# Patient Record
Sex: Male | Born: 1950 | Race: White | Hispanic: No | Marital: Married | Smoking: Current some day smoker
Health system: Southern US, Community
[De-identification: ages and names within clinical notes are randomized; demographics above are authoritative.]

## PROBLEM LIST (undated history)

## (undated) DIAGNOSIS — I428 Other cardiomyopathies: Secondary | ICD-10-CM

## (undated) DIAGNOSIS — I10 Essential (primary) hypertension: Secondary | ICD-10-CM

## (undated) DIAGNOSIS — N183 Chronic kidney disease, stage 3 unspecified: Secondary | ICD-10-CM

## (undated) DIAGNOSIS — I4729 Other ventricular tachycardia: Secondary | ICD-10-CM

## (undated) DIAGNOSIS — I472 Ventricular tachycardia: Secondary | ICD-10-CM

## (undated) DIAGNOSIS — Z9889 Other specified postprocedural states: Secondary | ICD-10-CM

## (undated) HISTORY — PX: VASECTOMY: SHX75

## (undated) HISTORY — PX: HYDROCELE EXCISION: SHX482

---

## 2005-07-17 ENCOUNTER — Ambulatory Visit (HOSPITAL_COMMUNITY): Admission: RE | Admit: 2005-07-17 | Discharge: 2005-07-17 | Payer: Self-pay | Admitting: Internal Medicine

## 2005-07-17 ENCOUNTER — Ambulatory Visit: Payer: Self-pay | Admitting: Internal Medicine

## 2010-12-14 ENCOUNTER — Other Ambulatory Visit (HOSPITAL_COMMUNITY): Payer: Self-pay | Admitting: Orthopaedic Surgery

## 2010-12-14 ENCOUNTER — Ambulatory Visit (HOSPITAL_COMMUNITY)
Admission: RE | Admit: 2010-12-14 | Discharge: 2010-12-14 | Disposition: A | Discharge status: Home / Self Care | Payer: 59 | Source: Ambulatory Visit | Attending: Orthopaedic Surgery | Admitting: Orthopaedic Surgery

## 2010-12-14 DIAGNOSIS — L0390 Cellulitis, unspecified: Secondary | ICD-10-CM

## 2010-12-14 DIAGNOSIS — S86819A Strain of other muscle(s) and tendon(s) at lower leg level, unspecified leg, initial encounter: Secondary | ICD-10-CM | POA: Insufficient documentation

## 2010-12-14 DIAGNOSIS — S83289A Other tear of lateral meniscus, current injury, unspecified knee, initial encounter: Secondary | ICD-10-CM | POA: Insufficient documentation

## 2010-12-14 DIAGNOSIS — M7120 Synovial cyst of popliteal space [Baker], unspecified knee: Secondary | ICD-10-CM | POA: Insufficient documentation

## 2010-12-14 DIAGNOSIS — X58XXXA Exposure to other specified factors, initial encounter: Secondary | ICD-10-CM | POA: Insufficient documentation

## 2010-12-14 DIAGNOSIS — S838X9A Sprain of other specified parts of unspecified knee, initial encounter: Secondary | ICD-10-CM | POA: Insufficient documentation

## 2010-12-14 DIAGNOSIS — M25569 Pain in unspecified knee: Secondary | ICD-10-CM | POA: Insufficient documentation

## 2011-04-24 HISTORY — PX: KNEE SURGERY: SHX244

## 2014-06-19 ENCOUNTER — Encounter (HOSPITAL_COMMUNITY): Payer: Self-pay | Admitting: Internal Medicine

## 2014-06-19 ENCOUNTER — Inpatient Hospital Stay (HOSPITAL_COMMUNITY)
Admission: AD | Admit: 2014-06-19 | Discharge: 2014-06-24 | DRG: 287 | Disposition: A | Discharge status: Home / Self Care | Payer: Self-pay | Source: Other Acute Inpatient Hospital | Attending: Internal Medicine | Admitting: Internal Medicine

## 2014-06-19 DIAGNOSIS — I129 Hypertensive chronic kidney disease with stage 1 through stage 4 chronic kidney disease, or unspecified chronic kidney disease: Secondary | ICD-10-CM | POA: Diagnosis present

## 2014-06-19 DIAGNOSIS — Z720 Tobacco use: Secondary | ICD-10-CM | POA: Diagnosis present

## 2014-06-19 DIAGNOSIS — E785 Hyperlipidemia, unspecified: Secondary | ICD-10-CM | POA: Diagnosis present

## 2014-06-19 DIAGNOSIS — R42 Dizziness and giddiness: Secondary | ICD-10-CM | POA: Diagnosis not present

## 2014-06-19 DIAGNOSIS — F17210 Nicotine dependence, cigarettes, uncomplicated: Secondary | ICD-10-CM | POA: Diagnosis present

## 2014-06-19 DIAGNOSIS — R112 Nausea with vomiting, unspecified: Secondary | ICD-10-CM

## 2014-06-19 DIAGNOSIS — Z9114 Patient's other noncompliance with medication regimen: Secondary | ICD-10-CM | POA: Diagnosis present

## 2014-06-19 DIAGNOSIS — D72829 Elevated white blood cell count, unspecified: Secondary | ICD-10-CM | POA: Diagnosis present

## 2014-06-19 DIAGNOSIS — N179 Acute kidney failure, unspecified: Secondary | ICD-10-CM

## 2014-06-19 DIAGNOSIS — N183 Chronic kidney disease, stage 3 unspecified: Secondary | ICD-10-CM

## 2014-06-19 DIAGNOSIS — I10 Essential (primary) hypertension: Secondary | ICD-10-CM | POA: Diagnosis present

## 2014-06-19 DIAGNOSIS — D649 Anemia, unspecified: Secondary | ICD-10-CM | POA: Diagnosis present

## 2014-06-19 DIAGNOSIS — I429 Cardiomyopathy, unspecified: Secondary | ICD-10-CM | POA: Diagnosis present

## 2014-06-19 DIAGNOSIS — I161 Hypertensive emergency: Secondary | ICD-10-CM | POA: Diagnosis present

## 2014-06-19 DIAGNOSIS — I509 Heart failure, unspecified: Secondary | ICD-10-CM | POA: Insufficient documentation

## 2014-06-19 DIAGNOSIS — R778 Other specified abnormalities of plasma proteins: Secondary | ICD-10-CM | POA: Diagnosis present

## 2014-06-19 DIAGNOSIS — I472 Ventricular tachycardia: Secondary | ICD-10-CM | POA: Diagnosis not present

## 2014-06-19 DIAGNOSIS — I5021 Acute systolic (congestive) heart failure: Principal | ICD-10-CM | POA: Diagnosis present

## 2014-06-19 DIAGNOSIS — D696 Thrombocytopenia, unspecified: Secondary | ICD-10-CM | POA: Diagnosis present

## 2014-06-19 DIAGNOSIS — R7989 Other specified abnormal findings of blood chemistry: Secondary | ICD-10-CM | POA: Diagnosis present

## 2014-06-19 DIAGNOSIS — R0600 Dyspnea, unspecified: Secondary | ICD-10-CM

## 2014-06-19 LAB — GLUCOSE, CAPILLARY: Glucose-Capillary: 84 mg/dL (ref 70–99)

## 2014-06-19 MED ORDER — ONDANSETRON HCL 4 MG/2ML IJ SOLN
4.0000 mg | Freq: Four times a day (QID) | INTRAMUSCULAR | Status: DC | PRN
  Administered 2014-06-20 – 2014-06-23 (×8): 4 mg via INTRAVENOUS
  Filled 2014-06-19 (×9): qty 2

## 2014-06-19 MED ORDER — SODIUM CHLORIDE 0.9 % IJ SOLN
3.0000 mL | Freq: Two times a day (BID) | INTRAMUSCULAR | Status: DC
Start: 2014-06-19 — End: 2014-06-24
  Administered 2014-06-19 – 2014-06-23 (×5): 3 mL via INTRAVENOUS

## 2014-06-19 MED ORDER — CETYLPYRIDINIUM CHLORIDE 0.05 % MT LIQD
7.0000 mL | Freq: Two times a day (BID) | OROMUCOSAL | Status: DC
  Administered 2014-06-19 – 2014-06-24 (×6): 7 mL via OROMUCOSAL

## 2014-06-19 MED ORDER — NITROGLYCERIN 0.2 MG/HR TD PT24
0.2000 mg | MEDICATED_PATCH | Freq: Every day | TRANSDERMAL | Status: DC
  Administered 2014-06-20 (×2): 0.2 mg via TRANSDERMAL
  Filled 2014-06-19 (×4): qty 1

## 2014-06-19 MED ORDER — FUROSEMIDE 10 MG/ML IJ SOLN
40.0000 mg | Freq: Every day | INTRAMUSCULAR | Status: DC
  Administered 2014-06-20 (×2): 40 mg via INTRAVENOUS
  Filled 2014-06-19 (×2): qty 4

## 2014-06-19 MED ORDER — SODIUM CHLORIDE 0.9 % IV SOLN
250.0000 mL | INTRAVENOUS | Status: DC | PRN
  Administered 2014-06-20: 250 mL via INTRAVENOUS

## 2014-06-19 MED ORDER — HYDRALAZINE HCL 20 MG/ML IJ SOLN: 5.0000 mg | INTRAMUSCULAR | Status: DC | PRN

## 2014-06-19 MED ORDER — HEPARIN SODIUM (PORCINE) 5000 UNIT/ML IJ SOLN
5000.0000 [IU] | Freq: Three times a day (TID) | INTRAMUSCULAR | Status: DC
  Administered 2014-06-20 (×2): 5000 [IU] via SUBCUTANEOUS
  Filled 2014-06-19 (×2): qty 1

## 2014-06-19 MED ORDER — AMLODIPINE BESYLATE 5 MG PO TABS
5.0000 mg | ORAL_TABLET | Freq: Every day | ORAL | Status: DC
  Administered 2014-06-20 – 2014-06-23 (×4): 5 mg via ORAL
  Filled 2014-06-19: qty 1
  Filled 2014-06-19: qty 2
  Filled 2014-06-19 (×2): qty 1

## 2014-06-19 MED ORDER — CHLORHEXIDINE GLUCONATE CLOTH 2 % EX PADS
6.0000 | MEDICATED_PAD | Freq: Once | CUTANEOUS | Status: AC
  Administered 2014-06-19: 6 via TOPICAL

## 2014-06-19 MED ORDER — NICOTINE 21 MG/24HR TD PT24
21.0000 mg | MEDICATED_PATCH | Freq: Every day | TRANSDERMAL | Status: DC
  Administered 2014-06-20: 21 mg via TRANSDERMAL
  Filled 2014-06-19 (×4): qty 1

## 2014-06-19 MED ORDER — ASPIRIN EC 81 MG PO TBEC: 81.0000 mg | DELAYED_RELEASE_TABLET | Freq: Every day | ORAL | Status: DC

## 2014-06-19 MED ORDER — MORPHINE SULFATE 2 MG/ML IJ SOLN: 2.0000 mg | INTRAMUSCULAR | Status: DC | PRN

## 2014-06-19 MED ORDER — ACETAMINOPHEN 325 MG PO TABS: 650.0000 mg | ORAL_TABLET | ORAL | Status: DC | PRN

## 2014-06-19 MED ORDER — SODIUM CHLORIDE 0.9 % IJ SOLN: 3.0000 mL | INTRAMUSCULAR | Status: DC | PRN

## 2014-06-19 MED ORDER — ASPIRIN EC 81 MG PO TBEC
81.0000 mg | DELAYED_RELEASE_TABLET | Freq: Every day | ORAL | Status: DC
  Administered 2014-06-20 – 2014-06-24 (×5): 81 mg via ORAL
  Filled 2014-06-19 (×5): qty 1

## 2014-06-19 NOTE — H&P (Signed)
[facility] History and Physical  Wesley Riley INO:[id number] DOB: [birth year] DOA: 06/19/2014  Referring physician: ED physician PCP: [name], DO  Specialists:   Chief Complaint: Shortness of breath  HPI: Wesley Riley is a 64 y.o. male with past medical history tobacco abuse, hypertension, medication noncompliance, who presents with shortness of breath.  Patient reports that he has hypertension and has not been taking his medications for more than a year. 4 weeks ago, he had one episode of shortness of breath. He was evaluated by his PCP, who thought his SOB was likely due to uncontrolled HTN. He was given prescription of benazepril and hydrochlorothiazide. He took the medications for a few days, and then stopped taking these medications because of headache. At about 3 AM, he started having shortness stress, which has been progressively getting worse. He had mild cough with clear mucus production. He also had chest pressure. He did not have fever or chills. He was evaluated in the emergency room of [facility] hospital. He was found to have pulmonary edema, elevated BNP and troponin 0.53. He was treated as acute congestive heart failure with Lasix and aspirin. He urinated well and feels better. He is transferred to [facility] hospital for further evaluation and treatment.  Patient denies fever, chills, abdominal pain, diarrhea, constipation, dysuria, urgency, frequency, hematuria, skin rashes, joint pain or leg swelling. No unilateral weakness, numbness or tingling sensations. No vision change or hearing loss.  In ED of [facility] hospital, patient was found to have elevated Bp at 162/114, elevated BNP 1127, elevated troponin 0.18--> 0.53, negative UDS, negative urinalysis for UTI, but positive for protein and hemoglobin, creatinine 1.33, temperature normal. Chest x-ray showed pulmonary edema. EKG showed T-wave inversion in V4 to V6. Patient is admitted to inpatient for further evaluation and  treatment.  Review of Systems: As presented in the history of presenting illness, rest negative.  Where does patient live?  At home Can patient participate in ADLs? Yes  Allergy:  Allergies  Allergen Reactions  . Penicillins     Past Medical History  Diagnosis Date  . HTN (hypertension)   . Tobacco abuse     Past Surgical History  Procedure Laterality Date  . Vasectomy    . Hydrocele excision      Social History:  reports that he has been smoking Cigars and Cigarettes.  He has a 46 pack-year smoking history. He uses smokeless tobacco. His alcohol and drug histories are not on file.  Family History:  Family History  Problem Relation Age of Onset  . Stroke Mother   . Heart disease       Prior to Admission medications   Not on File    Physical Exam: Filed Vitals:   06/19/14 2129  BP: 142/90  Pulse: 85  Temp: 98.5 F (36.9 C)  TempSrc: Oral  Resp: 20  SpO2: 100%   General: Not in acute distress HEENT:       Eyes: PERRL, EOMI, no scleral icterus       ENT: No discharge from the ears and nose, no pharynx injection, no tonsillar enlargement.        Neck: No JVD, no bruit, no mass felt. Cardiac: S1/S2, RRR, No murmurs, No gallops or rubs Pulm: diffused crackles bilaterally Abd: Soft, nondistended, nontender, no rebound pain, no organomegaly, BS present Ext: No edema bilaterally. 2+DP/PT pulse bilaterally Musculoskeletal: No joint deformities, erythema, or stiffness, ROM full Skin: No rashes.  Neuro: Alert and oriented X3, cranial nerves II-XII grossly  intact, muscle strength 5/5 in all extremeties, sensation to light touch intact. Brachial reflex 2+ bilaterally. Knee reflex 1+ bilaterally. Negative Babinski's sign. Normal finger to nose test. Psych: Patient is not psychotic, no suicidal or hemocidal ideation.  Labs on Admission:  Basic Metabolic Panel: No results for input(s): NA, K, CL, CO2, GLUCOSE, BUN, CREATININE, CALCIUM, MG, PHOS in the last 168  hours. Liver Function Tests: No results for input(s): AST, ALT, ALKPHOS, BILITOT, PROT, ALBUMIN in the last 168 hours. No results for input(s): LIPASE, AMYLASE in the last 168 hours. No results for input(s): AMMONIA in the last 168 hours. CBC: No results for input(s): WBC, NEUTROABS, HGB, HCT, MCV, PLT in the last 168 hours. Cardiac Enzymes: No results for input(s): CKTOTAL, CKMB, CKMBINDEX, TROPONINI in the last 168 hours.  BNP (last 3 results) No results for input(s): BNP in the last 8760 hours.  ProBNP (last 3 results) No results for input(s): PROBNP in the last 8760 hours.  CBG:  Recent Labs Lab 06/19/14 2226  GLUCAP 84    Radiological Exams on Admission: No results found.  EKG: Independently reviewed. T-wave inversion in V4 to V6.  Assessment/Plan Principal Problem:   Acute CHF Active Problems:   Tobacco abuse   Essential hypertension   Hypertensive emergency   Elevated troponin   Leukocytosis  Acute CHF: SOB is most likely due to flush of pulmonary edema secondary to acute congestive heart failure. Etiology is most liley due to elevated blood pressure secondary to medication noncompliance. Other differential diagnosis include, but less likely, pneumonia (no infiltration on chest x-ray) and pulmonary embolism (patient does not have any chest pain).  -will admit to Telemetry bed. -will treat with IV lasix 40 mg qdaily (patient received 2 dose of Lasix in ED) -ASA and amlodipine -NTG patch -will not start BB in the setting of acute CHF -will not start ACEI due to Cre=1.33, but may switch amlodipine to ACEI when renal Fx improves -will check TSH, A1c, FLP -will get 2-D echo to evaluate EF -will get EKG -strict In/Out -Daily body weight. -heart diet  HTN: Patient presents with hypertensive emergency with elevated blood pressure, acute congestive heart failure and worsening renal function. Trop is also elevated, which is likely due to demanding  ischemic. -started nitroglycerin patch -Amlodipine -On IV Lasix -IV hydralazine when necessary  Elevated trop: 0.18-->0.53. It is most likely due to demanding ischemia. Currently patient does not have chest pain. EKG has T-wave inversion in V4 to V6. -Aspirin, nitroglycerin patch, morphine when necessary, lipitor -will cycle CE X3  Leukocytosis: Likely due to stress. The patient does not have signs infection. Urinalysis negative for UTI. Chest x-ray has no infiltration -follow up CBC  Tobacco abuse: Smokes 1 pack day for more than 40 years. -Nicotine patch -Counseled pt about importance of quitting smoking.   DVT ppx: SQ Heparin    Code Status: Full code Family Communication: None at bed side.      Disposition Plan: Admit to inpatient   Date of Service 06/19/2014    Lorretta Harp [facility] Pager [phone]  If 7PM-7AM, please contact night-coverage [email or web address] Password [facility] 06/19/2014, 10:59 PM

## 2014-06-20 ENCOUNTER — Inpatient Hospital Stay (HOSPITAL_COMMUNITY): Payer: Self-pay

## 2014-06-20 DIAGNOSIS — I509 Heart failure, unspecified: Secondary | ICD-10-CM

## 2014-06-20 DIAGNOSIS — I214 Non-ST elevation (NSTEMI) myocardial infarction: Secondary | ICD-10-CM

## 2014-06-20 LAB — TROPONIN I
Troponin I: 0.27 ng/mL — ABNORMAL HIGH (ref <0.031)
Troponin I: 0.26 ng/mL — ABNORMAL HIGH (ref <0.031)
Troponin I: 0.15 ng/mL — ABNORMAL HIGH (ref <0.031)

## 2014-06-20 LAB — CBC WITH DIFFERENTIAL/PLATELET
Basophils Absolute: 0 10*3/uL (ref 0.0–0.1)
Basophils Relative: 0 % (ref 0–1)
Eosinophils Absolute: 0.1 10*3/uL (ref 0.0–0.7)
Eosinophils Relative: 1 % (ref 0–5)
HEMATOCRIT: 35.8 % — AB (ref 39.0–52.0)
HEMOGLOBIN: 12.1 g/dL — AB (ref 13.0–17.0)
Lymphocytes Relative: 25 % (ref 12–46)
Lymphs Abs: 2.7 10*3/uL (ref 0.7–4.0)
MCH: 32.5 pg (ref 26.0–34.0)
MCHC: 33.8 g/dL (ref 30.0–36.0)
MCV: 96.2 fL (ref 78.0–100.0)
MONOS PCT: 10 % (ref 3–12)
Monocytes Absolute: 1 10*3/uL (ref 0.1–1.0)
NEUTROS ABS: 6.6 10*3/uL (ref 1.7–7.7)
NEUTROS PCT: 64 % (ref 43–77)
Platelets: 126 10*3/uL — ABNORMAL LOW (ref 150–400)
RBC: 3.72 MIL/uL — ABNORMAL LOW (ref 4.22–5.81)
RDW: 13.7 % (ref 11.5–15.5)
WBC: 10.4 10*3/uL (ref 4.0–10.5)

## 2014-06-20 LAB — LIPID PANEL
CHOLESTEROL: 176 mg/dL (ref 0–200)
HDL: 41 mg/dL (ref >39)
LDL Cholesterol: 123 mg/dL — ABNORMAL HIGH (ref 0–99)
TRIGLYCERIDES: 58 mg/dL (ref <150)
Total CHOL/HDL Ratio: 4.3 RATIO
VLDL: 12 mg/dL (ref 0–40)

## 2014-06-20 LAB — COMPREHENSIVE METABOLIC PANEL
ALBUMIN: 3 g/dL — AB (ref 3.5–5.2)
ALK PHOS: 62 U/L (ref 39–117)
ALT: 13 U/L (ref 0–53)
AST: 17 U/L (ref 0–37)
Anion gap: 9 (ref 5–15)
BUN: 21 mg/dL (ref 6–23)
CHLORIDE: 104 mmol/L (ref 96–112)
CO2: 26 mmol/L (ref 19–32)
Calcium: 7.9 mg/dL — ABNORMAL LOW (ref 8.4–10.5)
Creatinine, Ser: 1.69 mg/dL — ABNORMAL HIGH (ref 0.50–1.35)
GFR calc Af Amer: 48 mL/min — ABNORMAL LOW (ref >90)
GFR, EST NON AFRICAN AMERICAN: 41 mL/min — AB (ref >90)
Glucose, Bld: 104 mg/dL — ABNORMAL HIGH (ref 70–99)
POTASSIUM: 3.1 mmol/L — AB (ref 3.5–5.1)
SODIUM: 139 mmol/L (ref 135–145)
Total Bilirubin: 0.6 mg/dL (ref 0.3–1.2)
Total Protein: 5.6 g/dL — ABNORMAL LOW (ref 6.0–8.3)

## 2014-06-20 LAB — BASIC METABOLIC PANEL
Anion gap: 8 (ref 5–15)
BUN: 20 mg/dL (ref 6–23)
CO2: 27 mmol/L (ref 19–32)
CREATININE: 1.59 mg/dL — AB (ref 0.50–1.35)
Calcium: 8.1 mg/dL — ABNORMAL LOW (ref 8.4–10.5)
Chloride: 105 mmol/L (ref 96–112)
GFR calc non Af Amer: 45 mL/min — ABNORMAL LOW (ref >90)
GFR, EST AFRICAN AMERICAN: 52 mL/min — AB (ref >90)
Glucose, Bld: 104 mg/dL — ABNORMAL HIGH (ref 70–99)
POTASSIUM: 3 mmol/L — AB (ref 3.5–5.1)
Sodium: 140 mmol/L (ref 135–145)

## 2014-06-20 LAB — CBC
HEMATOCRIT: 36.5 % — AB (ref 39.0–52.0)
Hemoglobin: 12.4 g/dL — ABNORMAL LOW (ref 13.0–17.0)
MCH: 33.1 pg (ref 26.0–34.0)
MCHC: 34 g/dL (ref 30.0–36.0)
MCV: 97.3 fL (ref 78.0–100.0)
Platelets: 115 10*3/uL — ABNORMAL LOW (ref 150–400)
RBC: 3.75 MIL/uL — ABNORMAL LOW (ref 4.22–5.81)
RDW: 13.8 % (ref 11.5–15.5)
WBC: 9 10*3/uL (ref 4.0–10.5)

## 2014-06-20 LAB — HEPARIN LEVEL (UNFRACTIONATED): Heparin Unfractionated: 0.18 IU/mL — ABNORMAL LOW (ref 0.30–0.70)

## 2014-06-20 LAB — SODIUM, URINE, RANDOM: Sodium, Ur: 47 mmol/L

## 2014-06-20 LAB — MAGNESIUM: Magnesium: 1.6 mg/dL (ref 1.5–2.5)

## 2014-06-20 LAB — CREATININE, URINE, RANDOM: CREATININE, URINE: 79.79 mg/dL

## 2014-06-20 LAB — MRSA PCR SCREENING: MRSA BY PCR: NEGATIVE

## 2014-06-20 LAB — TSH: TSH: 2.283 u[IU]/mL (ref 0.350–4.500)

## 2014-06-20 LAB — BRAIN NATRIURETIC PEPTIDE: B Natriuretic Peptide: 994.8 pg/mL — ABNORMAL HIGH (ref 0.0–100.0)

## 2014-06-20 MED ORDER — SODIUM CHLORIDE 0.9 % IV SOLN: 250.0000 mL | INTRAVENOUS | Status: DC | PRN

## 2014-06-20 MED ORDER — CARVEDILOL 3.125 MG PO TABS: 3.1250 mg | ORAL_TABLET | Freq: Two times a day (BID) | ORAL | Status: DC

## 2014-06-20 MED ORDER — SODIUM CHLORIDE 0.9 % IV SOLN
1.0000 mL/kg/h | INTRAVENOUS | Status: DC
  Administered 2014-06-21: 1 mL/kg/h via INTRAVENOUS

## 2014-06-20 MED ORDER — MAGNESIUM SULFATE 2 GM/50ML IV SOLN
2.0000 g | Freq: Once | INTRAVENOUS | Status: AC
  Administered 2014-06-20: 2 g via INTRAVENOUS
  Filled 2014-06-20: qty 50

## 2014-06-20 MED ORDER — FUROSEMIDE 10 MG/ML IJ SOLN: 40.0000 mg | Freq: Two times a day (BID) | INTRAMUSCULAR | Status: DC

## 2014-06-20 MED ORDER — HEPARIN BOLUS VIA INFUSION
2000.0000 [IU] | Freq: Once | INTRAVENOUS | Status: AC
  Administered 2014-06-20: 2000 [IU] via INTRAVENOUS
  Filled 2014-06-20: qty 2000

## 2014-06-20 MED ORDER — HEPARIN (PORCINE) IN NACL 100-0.45 UNIT/ML-% IJ SOLN
1350.0000 [IU]/h | INTRAMUSCULAR | Status: DC
  Administered 2014-06-20: 950 [IU]/h via INTRAVENOUS
  Filled 2014-06-20 (×2): qty 250

## 2014-06-20 MED ORDER — ATORVASTATIN CALCIUM 80 MG PO TABS
80.0000 mg | ORAL_TABLET | Freq: Every day | ORAL | Status: DC
  Administered 2014-06-20: 80 mg via ORAL
  Filled 2014-06-20: qty 1

## 2014-06-20 MED ORDER — SODIUM CHLORIDE 0.9 % IJ SOLN: 3.0000 mL | INTRAMUSCULAR | Status: DC | PRN

## 2014-06-20 MED ORDER — CARVEDILOL 3.125 MG PO TABS
3.1250 mg | ORAL_TABLET | Freq: Two times a day (BID) | ORAL | Status: DC
  Administered 2014-06-20 – 2014-06-24 (×8): 3.125 mg via ORAL
  Filled 2014-06-20 (×8): qty 1

## 2014-06-20 MED ORDER — ATORVASTATIN CALCIUM 40 MG PO TABS: 40.0000 mg | ORAL_TABLET | Freq: Every day | ORAL | Status: DC

## 2014-06-20 MED ORDER — POTASSIUM CHLORIDE CRYS ER 20 MEQ PO TBCR
40.0000 meq | EXTENDED_RELEASE_TABLET | Freq: Once | ORAL | Status: AC
  Administered 2014-06-20: 40 meq via ORAL
  Filled 2014-06-20: qty 2

## 2014-06-20 MED ORDER — POTASSIUM CHLORIDE CRYS ER 20 MEQ PO TBCR
40.0000 meq | EXTENDED_RELEASE_TABLET | Freq: Every day | ORAL | Status: DC
  Administered 2014-06-21 – 2014-06-24 (×4): 40 meq via ORAL
  Filled 2014-06-20 (×4): qty 2

## 2014-06-20 MED ORDER — ASPIRIN 81 MG PO CHEW
81.0000 mg | CHEWABLE_TABLET | ORAL | Status: AC
  Administered 2014-06-21: 81 mg via ORAL
  Filled 2014-06-20: qty 1

## 2014-06-20 MED ORDER — SODIUM CHLORIDE 0.9 % IJ SOLN
3.0000 mL | Freq: Two times a day (BID) | INTRAMUSCULAR | Status: DC
Start: 2014-06-20 — End: 2014-06-21
  Administered 2014-06-21: 3 mL via INTRAVENOUS

## 2014-06-20 NOTE — Progress Notes (Signed)
Pt resting in bed had 13 beats of v tach while resting in bed and denies any CP or SOB.  Md on call made aware. No new orders received. Will cont to monitor pt.

## 2014-06-20 NOTE — Progress Notes (Signed)
ANTICOAGULATION CONSULT NOTE - Follow-up Consult  Pharmacy Consult for Heparin Indication: chest pain/ACS  Allergies  Allergen Reactions  . Penicillins Hives and Rash    Patient Measurements: Height: 5' 9.5" (176.5 cm) Weight: 175 lb 14.4 oz (79.788 kg) IBW/kg (Calculated) : 71.85  Vital Signs: Temp: 97.8 F (36.6 C) (02/28 1645) Temp Source: Oral (02/28 1645) BP: 137/81 mmHg (02/28 1728) Pulse Rate: 88 (02/28 1728)  Labs:  Recent Labs  06/20/14 0118 06/20/14 0316 06/20/14 1120 06/20/14 1657  HGB 12.1* 12.4*  --   --   HCT 35.8* 36.5*  --   --   PLT 126* 115*  --   --   HEPARINUNFRC  --   --   --  0.18*  CREATININE 1.69* 1.59*  --   --   TROPONINI 0.27* 0.26* 0.15*  --     Estimated Creatinine Clearance: 48.4 mL/min (by C-G formula based on Cr of 1.59).   Assessment: 64 yo M on heparin for ACS. Heparin level subtherapeutic(0.18) on 950 units/hr. Level drawn ~5hr post heparin start. No issues with line or bleeding per RN.   Goal of Therapy:  Heparin level 0.3-0.7 units/ml Monitor platelets by anticoagulation protocol: Yes   Plan:  - Heparin IV bolus 2000 units - Heparin gtt increase to 1150 units/hr - F/u 6 hour HL - Daily HL/CBC  Christoper Fabian, PharmD, BCPS Clinical pharmacist, pager [phone] 06/20/2014 5:35 PM

## 2014-06-20 NOTE — Consult Note (Addendum)
Primary Physician: [name], DO Reason for Consultation: HF + troponin  HPI:  [name] is a 64 year old male with history of tobacco abuse, CKD stage III, hypertension (140/90 range - noncompliant with meds) who we are asked to see due to chest tightness and HF.  Denies h/o known heart disease. Had normal stress test in 2000.   Says he has not been taking his medication for over a year. On Friday night develop acute chest pressure associated with dypnea that lasted well over an hour. Breathing got worse so went to [facility] ER. At [facility], found to have pulmonary edema on CXR, elevated BNP and a troponin of 0.53. He has been responded well to IV Lasix and now feels better. ECG with diffuse TWI.  About 4 weeks ago had a similar episode of chest pressure but not as severe. Denies orthopnea, PND, progressive weight gain or edema.   Has had 15 beat NSVT    Review of Systems:     Cardiac Review of Systems: {Y] = yes [ ]  = no  Chest Pain [ y   ]  Resting SOB [ y  ] Exertional SOB  [  ]  Orthopnea [  ]   Pedal Edema [   ]    Palpitations [  ] Syncope  [  ]   Presyncope [   ]  General Review of Systems: [Y] = yes [  ]=no Constitional: recent weight change [  ]; anorexia [  ]; fatigue [  ]; nausea [  ]; night sweats [  ]; fever [  ]; or chills [  ];                 Eyes : blurred vision [  ]; diplopia [   ]; vision changes [  ];  Amaurosis fugax[  ]; Resp: cough [place].Etienne  ];  wheezing[  ];  hemoptysis[  ];  PND [  ];  GI:  gallstones[  ], vomiting[  ];  dysphagia[  ]; melena[  ];  hematochezia [  ]; heartburn[  ];   GU: kidney stones [  ]; hematuria[  ];   dysuria [  ];  nocturia[  ]; incontinence [  ];             Skin: rash, swelling[  ];, hair loss[  ];  peripheral edema[  ];  or itching[  ]; Musculosketetal: myalgias[  ];  joint swelling[  ];  joint erythema[  ];  joint pain[y  ];  back pain[  ];  Heme/Lymph: bruising[  ];  bleeding[  ];  anemia[  ];  Neuro: TIA[  ];  headaches[  ];   stroke[  ];  vertigo[  ];  seizures[  ];   paresthesias[  ];  difficulty walking[  ];  Psych:depression[  ]; anxiety[  ];  Endocrine: diabetes[  ];  thyroid dysfunction[  ];  Other:  Past Medical History  Diagnosis Date  . HTN (hypertension)   . Tobacco abuse     No prescriptions prior to admission     . amLODipine  5 mg Oral Daily  . antiseptic oral rinse  7 mL Mouth Rinse BID  . aspirin EC  81 mg Oral Daily  . atorvastatin  80 mg Oral q1800  . carvedilol  3.125 mg Oral BID WC  . furosemide  40 mg Intravenous BID  . heparin  5,000 Units Subcutaneous 3 times per day  . nicotine  21 mg Transdermal Daily  . nitroGLYCERIN  0.2 mg Transdermal Daily  . [START ON 06/21/2014] potassium chloride  40 mEq Oral Daily  . sodium chloride  3 mL Intravenous Q12H    Infusions:    Allergies  Allergen Reactions  . Penicillins     History   Social History  . Marital Status: Married    Spouse Name: N/A  . Number of Children: N/A  . Years of Education: N/A   Occupational History  . Not on file.   Social History Main Topics  . Smoking status: Current Every Day Smoker -- 1.00 packs/day for 46 years    Types: Cigars, Cigarettes  . Smokeless tobacco: Current User  . Alcohol Use: Not on file  . Drug Use: Not on file  . Sexual Activity: Not on file   Other Topics Concern  . Not on file   Social History Narrative  . No narrative on file    Family History  Problem Relation Age of Onset  . Stroke Mother   . Heart disease      PHYSICAL EXAM: Filed Vitals:   06/20/14 0735  BP: 128/84  Pulse: 79  Temp: 98.8 F (37.1 C)  Resp: 20     Intake/Output Summary (Last 24 hours) at 06/20/14 1005 Last data filed at 06/20/14 0826  Gross per 24 hour  Intake    595 ml  Output   1125 ml  Net   -530 ml    General:  Well appearing. No respiratory difficulty HEENT: normal Neck: supple. JVP 6. Carotids 2+ bilat; no bruits. No lymphadenopathy or thryomegaly appreciated. Cor: PMI  nondisplaced. Regular rate & rhythm. No rubs, gallops or murmurs. Lungs: basilar crackles  Abdomen: soft, nontender, nondistended. No hepatosplenomegaly. No bruits or masses. Good bowel sounds. Extremities: no cyanosis, clubbing, rash, edema Neuro: alert & oriented x 3, cranial nerves grossly intact. moves all 4 extremities w/o difficulty. Affect pleasant.  ECG: NSR with diffuse TWI   Results for orders placed or performed during the hospital encounter of 06/19/14 (from the past 24 hour(s))  MRSA PCR Screening     Status: None   Collection Time: 06/19/14  9:35 PM  Result Value Ref Range   MRSA by PCR NEGATIVE NEGATIVE  Glucose, capillary     Status: None   Collection Time: 06/19/14 10:26 PM  Result Value Ref Range   Glucose-Capillary 84 70 - 99 mg/dL  Creatinine, urine, random     Status: None   Collection Time: 06/20/14 12:14 AM  Result Value Ref Range   Creatinine, Urine 79.79 mg/dL  Sodium, urine, random     Status: None   Collection Time: 06/20/14 12:14 AM  Result Value Ref Range   Sodium, Ur 47 mmol/L  Troponin I (q 6hr x 3)     Status: Abnormal   Collection Time: 06/20/14  1:18 AM  Result Value Ref Range   Troponin I 0.27 (H) <0.031 ng/mL  Comprehensive metabolic panel     Status: Abnormal   Collection Time: 06/20/14  1:18 AM  Result Value Ref Range   Sodium 139 135 - 145 mmol/L   Potassium 3.1 (L) 3.5 - 5.1 mmol/L   Chloride 104 96 - 112 mmol/L   CO2 26 19 - 32 mmol/L   Glucose, Bld 104 (H) 70 - 99 mg/dL   BUN 21 6 - 23 mg/dL   Creatinine, Ser 6.44 (H) 0.50 - 1.35 mg/dL   Calcium 7.9 (L) 8.4 - 10.5 mg/dL  Total Protein 5.6 (L) 6.0 - 8.3 g/dL   Albumin 3.0 (L) 3.5 - 5.2 g/dL   AST 17 0 - 37 U/L   ALT 13 0 - 53 U/L   Alkaline Phosphatase 62 39 - 117 U/L   Total Bilirubin 0.6 0.3 - 1.2 mg/dL   GFR calc non Af Amer 41 (L) >90 mL/min   GFR calc Af Amer 48 (L) >90 mL/min   Anion gap 9 5 - 15  CBC with Differential/Platelet     Status: Abnormal   Collection Time:  06/20/14  1:18 AM  Result Value Ref Range   WBC 10.4 4.0 - 10.5 K/uL   RBC 3.72 (L) 4.22 - 5.81 MIL/uL   Hemoglobin 12.1 (L) 13.0 - 17.0 g/dL   HCT 83.6 (L) 62.9 - 47.6 %   MCV 96.2 78.0 - 100.0 fL   MCH 32.5 26.0 - 34.0 pg   MCHC 33.8 30.0 - 36.0 g/dL   RDW 54.6 50.3 - 54.6 %   Platelets 126 (L) 150 - 400 K/uL   Neutrophils Relative % 64 43 - 77 %   Neutro Abs 6.6 1.7 - 7.7 K/uL   Lymphocytes Relative 25 12 - 46 %   Lymphs Abs 2.7 0.7 - 4.0 K/uL   Monocytes Relative 10 3 - 12 %   Monocytes Absolute 1.0 0.1 - 1.0 K/uL   Eosinophils Relative 1 0 - 5 %   Eosinophils Absolute 0.1 0.0 - 0.7 K/uL   Basophils Relative 0 0 - 1 %   Basophils Absolute 0.0 0.0 - 0.1 K/uL  Brain natriuretic peptide     Status: Abnormal   Collection Time: 06/20/14  1:18 AM  Result Value Ref Range   B Natriuretic Peptide 994.8 (H) 0.0 - 100.0 pg/mL  Troponin I (q 6hr x 3)     Status: Abnormal   Collection Time: 06/20/14  3:16 AM  Result Value Ref Range   Troponin I 0.26 (H) <0.031 ng/mL  Basic metabolic panel     Status: Abnormal   Collection Time: 06/20/14  3:16 AM  Result Value Ref Range   Sodium 140 135 - 145 mmol/L   Potassium 3.0 (L) 3.5 - 5.1 mmol/L   Chloride 105 96 - 112 mmol/L   CO2 27 19 - 32 mmol/L   Glucose, Bld 104 (H) 70 - 99 mg/dL   BUN 20 6 - 23 mg/dL   Creatinine, Ser 5.68 (H) 0.50 - 1.35 mg/dL   Calcium 8.1 (L) 8.4 - 10.5 mg/dL   GFR calc non Af Amer 45 (L) >90 mL/min   GFR calc Af Amer 52 (L) >90 mL/min   Anion gap 8 5 - 15  CBC     Status: Abnormal   Collection Time: 06/20/14  3:16 AM  Result Value Ref Range   WBC 9.0 4.0 - 10.5 K/uL   RBC 3.75 (L) 4.22 - 5.81 MIL/uL   Hemoglobin 12.4 (L) 13.0 - 17.0 g/dL   HCT 12.7 (L) 51.7 - 00.1 %   MCV 97.3 78.0 - 100.0 fL   MCH 33.1 26.0 - 34.0 pg   MCHC 34.0 30.0 - 36.0 g/dL   RDW 74.9 44.9 - 67.5 %   Platelets 115 (L) 150 - 400 K/uL  Magnesium     Status: None   Collection Time: 06/20/14  3:16 AM  Result Value Ref Range    Magnesium 1.6 1.5 - 2.5 mg/dL  TSH     Status: None   Collection Time:  06/20/14  3:16 AM  Result Value Ref Range   TSH 2.283 0.350 - 4.500 uIU/mL  Lipid panel     Status: Abnormal   Collection Time: 06/20/14  3:16 AM  Result Value Ref Range   Cholesterol 176 0 - 200 mg/dL   Triglycerides 58 <829 mg/dL   HDL 41 >56 mg/dL   Total CHOL/HDL Ratio 4.3 RATIO   VLDL 12 0 - 40 mg/dL   LDL Cholesterol 213 (H) 0 - 99 mg/dL   No results found.   ASSESSMENT: 1. NSTEMI 2. Acute heart failure, EF unknown 3. Tobacco use ongoing 4. CKD, stage 3 5. NSVT 6. Hypokalemia 7. Hypertension   PLAN/DISCUSSION:  Although troponin is fairly flat, I suspect he had acute HF in setting of NSTEMI. (Although it is possible that all symptoms due to HF) He is now improved with diuresis and looks euvolemic. Suspect he has significant CAD.  Agree with echo. Will need cath. Start heparin, ASA, statin, low-dose b-blocker. Supp K and Mag. Hold lasix pre-cath. Counseled on need to stop smoking.   I have reviewed the risks, indications, and alternatives to angioplasty and stenting with the patient. Risks include but are not limited to bleeding, infection, vascular injury, stroke, myocardial infection, arrhythmia, kidney injury, radiation-related injury in the case of prolonged fluoroscopy use, emergency cardiac surgery, and death. The patient understands the risks of serious complication is low (<1%) and he agrees to proceed.    Daniel Bensimhon,MD 10:33 AM

## 2014-06-20 NOTE — Progress Notes (Signed)
CSW has asked Chaplain to see pt for Advance Directives as no packets available to CSW.  Unit CSW may be consulted for notary services, if necessary.

## 2014-06-20 NOTE — Progress Notes (Addendum)
[facility] PROGRESS NOTE  ROBB SIBAL ZOX:[id number] DOB: 04/08/1951 DOA: 06/19/2014 PCP: [name], DO  Brief summary  64 year old male with history of tobacco abuse, hypertension, medical noncompliance who presented with shortness of breath. He had not been taking his medication for over a year. He developed shortness of breath proximally 4 weeks prior to presentation and despite resuming his blood pressure medications he continued to have worsening shortness of breath, cough, chest pressure.  CHest pressure occurred at rest while working on his computer and was associated with SOB.  Not exertion-related tightness.  Has had weight loss over the last few years.  Denies lower extremity edema.  He was seen in [facility] and was transferred to [facility] because he had pulmonary edema, elevated BNP and a troponin of 0.53. He has been responding to IV Lasix.  Assessment/Plan  Acute systolic vs. Diastolic CHF, likely related to uncontrolled HTN and possible CAD - Tele:  13-beat run of V-tach -  ECG:  Deep t-wave inversions in inferolateral leads - continue lasix  IV and increase to BID - ASA and amlodipine - NTG patch - start low dose carvedilol - no ACEI 2/2 kidney function  - TSH wnl - A1c pending -  FLP:  LDL elevated, start statin -  -1.1L -  Daily weights -  Heart failure education  V-tach -  Start beta blocker, low dose -  Keep K and Mag wnl  History sounds concerning for Unstable angina, rise in troponin suggests NSTEMI although AKI/CKD may have elevated troponin also/hypertensive emergency, BP trending down -Aspirin, nitroglycerin patch, morphine when necessary, lipitor -  Troponin peak at 0.53, trended down to 0.26 -  Some elevation may be related to - continue nitroglycerin patch - Amlodipine - lasix as above - IV hydralazine when necessary - start heparin gtt  Acute vs. Chronic kidney disease  -  RUS -  Trend creatinine -  Renally  dose medications and minimize nephrotoxins  Leukocytosis: Likely due to stress, resolved.   -  No evidence of underlying infection  Tobacco abuse: Smokes 1 pack day for more than 40 years. -Nicotine patch -Counseled pt about importance of quitting smoking.  Normocytic anemia and thrombocytopenia  -  Check differential -  Iron studies, b12, folate -  TSH wnl -  Occult stool -  No evidence fo bleeding  Diet:  Low salt Access:  PIV IVF:  off Proph:  heparin  Code Status: full Family Communication: patient and wife Disposition Plan: pending improvement in breathing, further ischemic work up as indicated per cardiology   Consultants:  Cardiology  Procedures:  ECHO  RUS  Antibiotics:  none   HPI/Subjective:  Breathing is much better.  No current chest pain or SOB.  Attributes his breathing problems to post-nasal gtt and mold/mildew problems.    Objective: Filed Vitals:   06/20/14 0000 06/20/14 0021 06/20/14 0421 06/20/14 0735  BP: 131/78 133/86 131/88 128/84  Pulse: 79 81 80 79  Temp:  98.2 F (36.8 C) 99.1 F (37.3 C) 98.8 F (37.1 C)  TempSrc:  Oral Oral Oral  Resp: Weight:   79.788 kg (175 lb 14.4 oz)   SpO2: 97% 97% 96% 96%    Intake/Output Summary (Last 24 hours) at 06/20/14 0936 Last data filed at 06/20/14 0826  Gross per 24 hour  Intake    595 ml  Output   1125 ml  Net   -530 ml   [employer]  06/20/14 0421  Weight: 79.788 kg (175 lb 14.4 oz)    Exam:   General:  WM, No acute distress  HEENT:  NCAT, MMM  Cardiovascular:  RRR, nl S1, S2, + gallop, 2+ pulses, warm extremities  Respiratory:  Rales at bilateral bases, no rhonchi or wheeze, no increased WOB  Abdomen:   NABS, soft, NT/ND  MSK:   Normal tone and bulk, no LEE  Neuro:  Grossly intact  Data Reviewed: Basic Metabolic Panel:  Recent Labs Lab 06/20/14 0118 06/20/14 0316  NA 139 140  K 3.1* 3.0*  CL 104 105  CO2 26 27  GLUCOSE 104* 104*  BUN 21 20   CREATININE 1.69* 1.59*  CALCIUM 7.9* 8.1*  MG  --  1.6   Liver Function Tests:  Recent Labs Lab 06/20/14 0118  AST 17  ALT 13  ALKPHOS 62  BILITOT 0.6  PROT 5.6*  ALBUMIN 3.0*   No results for input(s): LIPASE, AMYLASE in the last 168 hours. No results for input(s): AMMONIA in the last 168 hours. CBC:  Recent Labs Lab 06/20/14 0118 06/20/14 0316  WBC 10.4 9.0  NEUTROABS 6.6  --   HGB 12.1* 12.4*  HCT 35.8* 36.5*  MCV 96.2 97.3  PLT 126* 115*   Cardiac Enzymes:  Recent Labs Lab 06/20/14 0118 06/20/14 0316  TROPONINI 0.27* 0.26*   BNP (last 3 results)  Recent Labs  06/20/14 0118  BNP 994.8*    ProBNP (last 3 results) No results for input(s): PROBNP in the last 8760 hours.  CBG:  Recent Labs Lab 06/19/14 2226  GLUCAP 84    Recent Results (from the past 240 hour(s))  MRSA PCR Screening     Status: None   Collection Time: 06/19/14  9:35 PM  Result Value Ref Range Status   MRSA by PCR NEGATIVE NEGATIVE Final    Comment:        The GeneXpert MRSA Assay (FDA approved for NASAL specimens only), is one component of a comprehensive MRSA colonization surveillance program. It is not intended to diagnose MRSA infection nor to guide or monitor treatment for MRSA infections.      Studies: No results found.  Scheduled Meds: . amLODipine  5 mg Oral Daily  . antiseptic oral rinse  7 mL Mouth Rinse BID  . aspirin EC  81 mg Oral Daily  . atorvastatin  40 mg Oral q1800  . furosemide  40 mg Intravenous Daily  . heparin  5,000 Units Subcutaneous 3 times per day  . nicotine  21 mg Transdermal Daily  . nitroGLYCERIN  0.2 mg Transdermal Daily  . sodium chloride  3 mL Intravenous Q12H   Continuous Infusions:   Principal Problem:   Acute CHF Active Problems:   Tobacco abuse   Essential hypertension   Hypertensive emergency   Elevated troponin   Leukocytosis    Time spent: 30 min    Moneisha Vosler  [facility] Pager [phone].  If 7PM-7AM, please contact night-coverage at [email or web address], password [facility] 06/20/2014, 9:36 AM  LOS: 1 day

## 2014-06-20 NOTE — Progress Notes (Signed)
ANTICOAGULATION CONSULT NOTE - Initial Consult  Pharmacy Consult for Heparin Indication: chest pain/ACS  Allergies  Allergen Reactions  . Penicillins     Patient Measurements: Height: 5' 9.5" (176.5 cm) Weight: 175 lb 14.4 oz (79.788 kg) IBW/kg (Calculated) : 71.85  Vital Signs: Temp: 98.8 F (37.1 C) (02/28 0735) Temp Source: Oral (02/28 0735) BP: 128/84 mmHg (02/28 0735) Pulse Rate: 79 (02/28 0735)  Labs:  Recent Labs  06/20/14 0118 06/20/14 0316  HGB 12.1* 12.4*  HCT 35.8* 36.5*  PLT 126* 115*  CREATININE 1.69* 1.59*  TROPONINI 0.27* 0.26*    Estimated Creatinine Clearance: 48.4 mL/min (by C-G formula based on Cr of 1.59).   Medical History: Past Medical History  Diagnosis Date  . HTN (hypertension)   . Tobacco abuse     Medications:  Scheduled:  . amLODipine  5 mg Oral Daily  . antiseptic oral rinse  7 mL Mouth Rinse BID  . aspirin EC  81 mg Oral Daily  . atorvastatin  80 mg Oral q1800  . carvedilol  3.125 mg Oral BID WC  . furosemide  40 mg Intravenous BID  . nicotine  21 mg Transdermal Daily  . nitroGLYCERIN  0.2 mg Transdermal Daily  . [START ON 06/21/2014] potassium chloride  40 mEq Oral Daily  . sodium chloride  3 mL Intravenous Q12H   Assessment: 64 yo M admitted on 06/19/2014 with SOB. Pharmacy has now been consulted to dose heparin for ACS. H/H is low but stable, pltc has trended down slightly but no reported significant s/s bleeding.   Goal of Therapy:  Heparin level 0.3-0.7 units/ml Monitor platelets by anticoagulation protocol: Yes   Plan:  - Initiate heparin gtt at 950 u/hr (no bolus since recently received SQ heparin) - F/u 6 hour HL - Daily HL/CBC - F/u plans for cath?  Tyler Deis. Bonnye Fava, PharmD Clinical Pharmacist - Resident Pager: [phone] Pharmacy: [phone] 06/20/2014 10:35 AM

## 2014-06-20 NOTE — Progress Notes (Signed)
  Echocardiogram 2D Echocardiogram has been performed.  [name] 06/20/2014, 10:00 AM

## 2014-06-20 NOTE — Progress Notes (Signed)
Utilization Review Completed.   Guerin Lashomb, RN, BSN Nurse Case Manager  

## 2014-06-21 ENCOUNTER — Encounter (HOSPITAL_COMMUNITY): Payer: Self-pay | Admitting: *Deleted

## 2014-06-21 ENCOUNTER — Encounter (HOSPITAL_COMMUNITY)
Admission: AD | Disposition: A | Discharge status: Home / Self Care | Payer: Self-pay | Source: Other Acute Inpatient Hospital | Attending: Internal Medicine

## 2014-06-21 ENCOUNTER — Inpatient Hospital Stay (HOSPITAL_COMMUNITY): Payer: Self-pay

## 2014-06-21 DIAGNOSIS — R079 Chest pain, unspecified: Secondary | ICD-10-CM

## 2014-06-21 DIAGNOSIS — E785 Hyperlipidemia, unspecified: Secondary | ICD-10-CM

## 2014-06-21 DIAGNOSIS — N189 Chronic kidney disease, unspecified: Secondary | ICD-10-CM

## 2014-06-21 DIAGNOSIS — I5021 Acute systolic (congestive) heart failure: Secondary | ICD-10-CM

## 2014-06-21 DIAGNOSIS — I10 Essential (primary) hypertension: Secondary | ICD-10-CM

## 2014-06-21 DIAGNOSIS — Z720 Tobacco use: Secondary | ICD-10-CM

## 2014-06-21 HISTORY — PX: LEFT HEART CATHETERIZATION WITH CORONARY ANGIOGRAM: SHX5451

## 2014-06-21 LAB — BASIC METABOLIC PANEL
ANION GAP: 4 — AB (ref 5–15)
BUN: 23 mg/dL (ref 6–23)
CO2: 28 mmol/L (ref 19–32)
CREATININE: 1.57 mg/dL — AB (ref 0.50–1.35)
Calcium: 7.9 mg/dL — ABNORMAL LOW (ref 8.4–10.5)
Chloride: 106 mmol/L (ref 96–112)
GFR, EST AFRICAN AMERICAN: 52 mL/min — AB (ref >90)
GFR, EST NON AFRICAN AMERICAN: 45 mL/min — AB (ref >90)
Glucose, Bld: 128 mg/dL — ABNORMAL HIGH (ref 70–99)
Potassium: 3.5 mmol/L (ref 3.5–5.1)
SODIUM: 138 mmol/L (ref 135–145)

## 2014-06-21 LAB — CBC
HEMATOCRIT: 34.7 % — AB (ref 39.0–52.0)
HEMOGLOBIN: 11.8 g/dL — AB (ref 13.0–17.0)
MCH: 33.1 pg (ref 26.0–34.0)
MCHC: 34 g/dL (ref 30.0–36.0)
MCV: 97.5 fL (ref 78.0–100.0)
PLATELETS: 114 10*3/uL — AB (ref 150–400)
RBC: 3.56 MIL/uL — AB (ref 4.22–5.81)
RDW: 13.7 % (ref 11.5–15.5)
WBC: 7.5 10*3/uL (ref 4.0–10.5)

## 2014-06-21 LAB — HEPARIN LEVEL (UNFRACTIONATED)
HEPARIN UNFRACTIONATED: 0.2 [IU]/mL — AB (ref 0.30–0.70)
HEPARIN UNFRACTIONATED: 0.36 [IU]/mL (ref 0.30–0.70)

## 2014-06-21 LAB — IRON AND TIBC
Iron: 24 ug/dL — ABNORMAL LOW (ref 42–165)
SATURATION RATIOS: 12 % — AB (ref 20–55)
TIBC: 201 ug/dL — AB (ref 215–435)
UIBC: 177 ug/dL (ref 125–400)

## 2014-06-21 LAB — OCCULT BLOOD X 1 CARD TO LAB, STOOL: Fecal Occult Bld: NEGATIVE

## 2014-06-21 LAB — HEMOGLOBIN A1C
Hgb A1c MFr Bld: 5.8 % — ABNORMAL HIGH (ref 4.8–5.6)
Mean Plasma Glucose: 120 mg/dL

## 2014-06-21 LAB — PROTIME-INR
INR: 1.15 (ref 0.00–1.49)
Prothrombin Time: 14.9 seconds (ref 11.6–15.2)

## 2014-06-21 LAB — HIV ANTIBODY (ROUTINE TESTING W REFLEX): HIV Screen 4th Generation wRfx: NONREACTIVE

## 2014-06-21 LAB — FERRITIN: FERRITIN: 262 ng/mL (ref 22–322)

## 2014-06-21 LAB — VITAMIN B12: VITAMIN B 12: 429 pg/mL (ref 211–911)

## 2014-06-21 LAB — MAGNESIUM: Magnesium: 2.1 mg/dL (ref 1.5–2.5)

## 2014-06-21 SURGERY — LEFT HEART CATHETERIZATION WITH CORONARY ANGIOGRAM
Anesthesia: LOCAL

## 2014-06-21 MED ORDER — SODIUM CHLORIDE 0.9 % IV SOLN: INTRAVENOUS | Status: AC

## 2014-06-21 MED ORDER — VERAPAMIL HCL 2.5 MG/ML IV SOLN
INTRAVENOUS | Status: AC
  Filled 2014-06-21: qty 2

## 2014-06-21 MED ORDER — HEPARIN (PORCINE) IN NACL 2-0.9 UNIT/ML-% IJ SOLN
INTRAMUSCULAR | Status: AC
  Filled 2014-06-21: qty 1000

## 2014-06-21 MED ORDER — FERROUS SULFATE 325 (65 FE) MG PO TABS
325.0000 mg | ORAL_TABLET | Freq: Two times a day (BID) | ORAL | Status: DC
  Administered 2014-06-21 – 2014-06-24 (×6): 325 mg via ORAL
  Filled 2014-06-21 (×6): qty 1

## 2014-06-21 MED ORDER — LIDOCAINE HCL (PF) 1 % IJ SOLN
INTRAMUSCULAR | Status: AC
  Filled 2014-06-21: qty 30

## 2014-06-21 MED ORDER — HEPARIN BOLUS VIA INFUSION
1200.0000 [IU] | Freq: Once | INTRAVENOUS | Status: AC
  Administered 2014-06-21: 1200 [IU] via INTRAVENOUS
  Filled 2014-06-21: qty 1200

## 2014-06-21 MED ORDER — HEPARIN SODIUM (PORCINE) 1000 UNIT/ML IJ SOLN
INTRAMUSCULAR | Status: AC
  Filled 2014-06-21: qty 1

## 2014-06-21 MED ORDER — FENTANYL CITRATE 0.05 MG/ML IJ SOLN
INTRAMUSCULAR | Status: AC
  Filled 2014-06-21: qty 2

## 2014-06-21 MED ORDER — NITROGLYCERIN 1 MG/10 ML FOR IR/CATH LAB
INTRA_ARTERIAL | Status: AC
  Filled 2014-06-21: qty 10

## 2014-06-21 MED ORDER — MIDAZOLAM HCL 2 MG/2ML IJ SOLN
INTRAMUSCULAR | Status: AC
  Filled 2014-06-21: qty 2

## 2014-06-21 MED ORDER — FUROSEMIDE 10 MG/ML IJ SOLN
40.0000 mg | Freq: Two times a day (BID) | INTRAMUSCULAR | Status: DC
  Administered 2014-06-21 – 2014-06-23 (×4): 40 mg via INTRAVENOUS
  Filled 2014-06-21 (×5): qty 4

## 2014-06-21 NOTE — Progress Notes (Addendum)
[facility] PROGRESS NOTE  NAS WAFER UJW:[id number] DOB: Oct 25, 1950 DOA: 06/19/2014 PCP: [name], DO  Brief summary  64 year old male with history of tobacco abuse, hypertension, medical noncompliance who presented with shortness of breath. He had not been taking his medication for over a year. He developed shortness of breath proximally 4 weeks prior to presentation and despite resuming his blood pressure medications he continued to have worsening shortness of breath, cough, chest pressure.  CHest pressure occurred at rest while working on his computer and was associated with SOB.  Not exertion-related tightness.  Has had weight loss over the last few years.  Denies lower extremity edema.  He was seen in [facility] and was transferred to [facility] because he had pulmonary edema, elevated BNP and a troponin of 0.53.  Suspect that he had NSTEMI with acute systolic heart failure.  Awaiting heart catheterization.  Still having nausea and lightheadedness   Assessment/Plan  Acute systolic congestive heart failure likely ICM in setting of NSTEMI and uncontrolled HTN  - Continue ASA, statin, BB - Continue NTG patch - no ACEI 2/2 kidney function until we have ruled out AKI - TSH wnl - A1c 5.8 -  Troponin peak at 0.53, trended down to 0.26 - lasix held for cath - heparin gtt day 2 -  Will need lasix/spironolactone added once creatinine has stabilized  HTN, mildly elevated -  BB, norvasc  V-tach -  Continue beta blocker, low dose -  Keep K and Mag wnl  Acute vs. Chronic kidney disease, probably CKD but will check creatinine in AM -  RUS:  normal -  Trend creatinine:  Approximately stable -  Renally dose medications and minimize nephrotoxins  Leukocytosis: Likely due to stress, resolved. No evidence of underlying infection  Tobacco abuse: Smokes 1 pack day for more than 40 years. -Nicotine patch -Counseled pt about importance of quitting  smoking.  Normocytic anemia and thrombocytopenia  -  Check differential -  Iron studies suggest iron deficiency -  Start iron supplementation -   b12 429, folate pending -  HIV NR -  TSH wnl -  Occult stool neg -  Needs GI follow up  Diet:  NPO Access:  PIV IVF:  yes Proph:  Heparin gtt  Code Status: full Family Communication: patient and wife Disposition Plan: pending improvement in breathing, further ischemic work up as indicated per cardiology   Consultants:  Cardiology  Procedures:  ECHO  RUS  Antibiotics:  none   HPI/Subjective:  Having some nausea and abdominal pressure.  The latter got better with BM this AM.  Still having some dizziness. No longer having SOB and chest tightness.  On O2.  Objective: Filed Vitals:   06/21/14 0000 06/21/14 0400 06/21/14 0738 06/21/14 1115  BP: 117/78 128/82 129/85 145/93  Pulse: 82 74 80 80  Temp: 98.8 F (37.1 C) 98.2 F (36.8 C) 98.3 F (36.8 C)   TempSrc: Oral Oral Oral   Resp: Height:      Weight:  80.332 kg (177 lb 1.6 oz)    SpO2: 94% 95% 94% 97%    Intake/Output Summary (Last 24 hours) at 06/21/14 1135 Last data filed at 06/21/14 1119  Gross per 24 hour  Intake    355 ml  Output   1150 ml  Net   -795 ml   Filed Weights   06/20/14 0421 06/21/14 0400  Weight: 79.788 kg (175 lb 14.4 oz) 80.332 kg (177 lb  1.6 oz)    Exam:   General:  WM, No acute distress  HEENT:  NCAT, MMM  Cardiovascular:  RRR, nl S1, S2, + gallop, 2+ pulses, warm extremities  Respiratory:  CTAB, no increased WOB  Abdomen:   NABS, soft, NT/ND  MSK:   Normal tone and bulk, no LEE  Neuro:  Grossly intact  Data Reviewed: Basic Metabolic Panel:  Recent Labs Lab 06/20/14 0118 06/20/14 0316 06/21/14 0010  NA 139 140 138  K 3.1* 3.0* 3.5  CL 104 105 106  CO2 GLUCOSE 104* 104* 128*  BUN CREATININE 1.69* 1.59* 1.57*  CALCIUM 7.9* 8.1* 7.9*  MG  --  1.6 2.1   Liver Function  Tests:  Recent Labs Lab 06/20/14 0118  AST 17  ALT 13  ALKPHOS 62  BILITOT 0.6  PROT 5.6*  ALBUMIN 3.0*   No results for input(s): LIPASE, AMYLASE in the last 168 hours. No results for input(s): AMMONIA in the last 168 hours. CBC:  Recent Labs Lab 06/20/14 0118 06/20/14 0316 06/21/14 0010  WBC 10.4 9.0 7.5  NEUTROABS 6.6  --   --   HGB 12.1* 12.4* 11.8*  HCT 35.8* 36.5* 34.7*  MCV 96.2 97.3 97.5  PLT 126* 115* 114*   Cardiac Enzymes:  Recent Labs Lab 06/20/14 0118 06/20/14 0316 06/20/14 1120  TROPONINI 0.27* 0.26* 0.15*   BNP (last 3 results)  Recent Labs  06/20/14 0118  BNP 994.8*    ProBNP (last 3 results) No results for input(s): PROBNP in the last 8760 hours.  CBG:  Recent Labs Lab 06/19/14 2226  GLUCAP 84    Recent Results (from the past 240 hour(s))  MRSA PCR Screening     Status: None   Collection Time: 06/19/14  9:35 PM  Result Value Ref Range Status   MRSA by PCR NEGATIVE NEGATIVE Final    Comment:        The GeneXpert MRSA Assay (FDA approved for NASAL specimens only), is one component of a comprehensive MRSA colonization surveillance program. It is not intended to diagnose MRSA infection nor to guide or monitor treatment for MRSA infections.      Studies: US Renal  06/20/2014   CLINICAL DATA:  64 year old male with acute kidney injury  EXAM: RENAL/URINARY TRACT ULTRASOUND COMPLETE  COMPARISON:  None.  FINDINGS: Right Kidney:  Length: 10.2 cm. Echogenicity within normal limits. No mass or hydronephrosis visualized.  Left Kidney:  Length: 10.2 cm. Echogenicity within normal limits. No mass or hydronephrosis visualized. Dromedary hump.  Bladder:  Appears normal for degree of bladder distention. The prostate gland appears enlarged and does indent the bladder base.  Other: Visualized liver parenchyma is echogenic and slightly coarsened.  IMPRESSION: 1. No evidence of hydronephrosis or other acute renal abnormality. 2. Prostatomegaly  with indention of the bladder base. 3. Slightly echogenic and coarsened visualized liver parenchyma suggests possible hepatic steatosis.   Electronically Signed   By: Malachy Moan M.D.   On: 06/20/2014 10:23   Dg Chest Port 1 View  06/21/2014   CLINICAL DATA:  Dyspnea.  Pulmonary edema.  EXAM: PORTABLE CHEST - 1 VIEW  COMPARISON:  06/19/2014  FINDINGS: There is marked improvement of the pulmonary edema with slight residual edema at the right lung base. There is minimal residual atelectasis at the left lung base.  Heart size and pulmonary vascularity are normal.  No acute osseous abnormality.  IMPRESSION: Almost complete resolution of pulmonary edema.  Electronically Signed   By: Francene Boyers M.D.   On: 06/21/2014 07:46    Scheduled Meds: . amLODipine  5 mg Oral Daily  . antiseptic oral rinse  7 mL Mouth Rinse BID  . aspirin EC  81 mg Oral Daily  . atorvastatin  80 mg Oral q1800  . carvedilol  3.125 mg Oral BID WC  . nicotine  21 mg Transdermal Daily  . nitroGLYCERIN  0.2 mg Transdermal Daily  . potassium chloride  40 mEq Oral Daily  . sodium chloride  3 mL Intravenous Q12H  . sodium chloride  3 mL Intravenous Q12H   Continuous Infusions: . sodium chloride 1 mL/kg/hr (06/21/14 0436)  . heparin 1,350 Units/hr (06/21/14 1119)    Principal Problem:   Acute CHF Active Problems:   Tobacco abuse   Essential hypertension   Hypertensive emergency   Elevated troponin   Leukocytosis    Time spent: 30 min    Luciano Cinquemani, [facility]  [facility] Pager [phone]. If 7PM-7AM, please contact night-coverage at [email or web address], password [facility] 06/21/2014, 11:35 AM  LOS: 2 days

## 2014-06-21 NOTE — Progress Notes (Signed)
Patient Profile: 63-year-old male with history of tobacco abuse, CKD stage III, hypertension admitted for acute HF. Also with elevated troponin. High suspicion for CAD.   Subjective: Currently CP free. No dyspnea. He has concerns regarding his taking Coreg. States he had unwanted side effects in the past with metoprolol (nausea + dizziness).   Objective: Vital signs in last 24 hours: Temp:  [97.5 F (36.4 C)-98.9 F (37.2 C)] 98.3 F (36.8 C) (02/29 0738) Pulse Rate:  [74-89] 80 (02/29 0738) Resp:  [18-20] 18 (02/29 0738) BP: (117-137)/(78-88) 129/85 mmHg (02/29 0738) SpO2:  [92 %-95 %] 94 % (02/29 0738) Weight:  [177 lb 1.6 oz (80.332 kg)] 177 lb 1.6 oz (80.332 kg) (02/29 0400) Last BM Date: 06/19/14  Intake/Output from previous day: 02/28 0701 - 02/29 0700 In: 1193 [P.O.:1190; I.V.:3] Out: 950 [Urine:950] Intake/Output this shift:    Medications Current Facility-Administered Medications  Medication Dose Route Frequency Provider Last Rate Last Dose  . 0.9 %  sodium chloride infusion  250 mL Intravenous PRN Xilin Niu, MD 10 mL/hr at 06/20/14 0745 250 mL at 06/20/14 0745  . 0.9 %  sodium chloride infusion  250 mL Intravenous PRN Daniel R Bensimhon, MD      . 0.9 %  sodium chloride infusion  1 mL/kg/hr Intravenous Continuous Daniel R Bensimhon, MD 79.8 mL/hr at 06/21/14 0436 1 mL/kg/hr at 06/21/14 0436  . acetaminophen (TYLENOL) tablet 650 mg  650 mg Oral Q4H PRN Xilin Niu, MD      . amLODipine (NORVASC) tablet 5 mg  5 mg Oral Daily Xilin Niu, MD   5 mg at 06/20/14 0927  . antiseptic oral rinse (CPC / CETYLPYRIDINIUM CHLORIDE 0.05%) solution 7 mL  7 mL Mouth Rinse BID Xilin Niu, MD   7 mL at 06/20/14 0931  . aspirin EC tablet 81 mg  81 mg Oral Daily Xilin Niu, MD   81 mg at 06/20/14 0927  . atorvastatin (LIPITOR) tablet 80 mg  80 mg Oral q1800 Mackenzie Short, MD   80 mg at 06/20/14 1728  . carvedilol (COREG) tablet 3.125 mg  3.125 mg Oral BID WC Mackenzie Short, MD   3.125 mg at  06/20/14 1728  . heparin ADULT infusion 100 units/mL (25000 units/250 mL)  1,150 Units/hr Intravenous Continuous Caron George Amend, RPH 11.5 mL/hr at 06/20/14 1748 1,150 Units/hr at 06/20/14 1748  . hydrALAZINE (APRESOLINE) injection 5 mg  5 mg Intravenous Q2H PRN Xilin Niu, MD      . morphine 2 MG/ML injection 2 mg  2 mg Intravenous Q3H PRN Xilin Niu, MD      . nicotine (NICODERM CQ - dosed in mg/24 hours) patch 21 mg  21 mg Transdermal Daily Xilin Niu, MD   21 mg at 06/20/14 0927  . nitroGLYCERIN (NITRODUR - Dosed in mg/24 hr) patch 0.2 mg  0.2 mg Transdermal Daily Xilin Niu, MD   0.2 mg at 06/20/14 1137  . ondansetron (ZOFRAN) injection 4 mg  4 mg Intravenous Q6H PRN Xilin Niu, MD   4 mg at 06/20/14 2031  . potassium chloride SA (K-DUR,KLOR-CON) CR tablet 40 mEq  40 mEq Oral Daily Mackenzie Short, MD      . sodium chloride 0.9 % injection 3 mL  3 mL Intravenous Q12H Xilin Niu, MD   3 mL at 06/20/14 1133  . sodium chloride 0.9 % injection 3 mL  3 mL Intravenous PRN Xilin Niu, MD      . sodium chloride 0.9 % injection 3 mL    3 mL Intravenous Q12H Daniel R Bensimhon, MD   0 mL at 06/20/14 2200  . sodium chloride 0.9 % injection 3 mL  3 mL Intravenous PRN Daniel R Bensimhon, MD        PE: General appearance: alert, cooperative and no distress Neck: no carotid bruit and no JVD Lungs: clear to auscultation bilaterally Heart: regular rate and rhythm, S1, S2 normal, no murmur, click, rub or gallop Extremities: trace LEE Pulses: 2+ and symmetric Skin: warm and dry Neurologic: Grossly normal  Lab Results:   Recent Labs  06/20/14 0118 06/20/14 0316 06/21/14 0010  WBC 10.4 9.0 7.5  HGB 12.1* 12.4* 11.8*  HCT 35.8* 36.5* 34.7*  PLT 126* 115* 114*   BMET  Recent Labs  06/20/14 0118 06/20/14 0316 06/21/14 0010  NA 139 140 138  K 3.1* 3.0* 3.5  CL 104 105 106  CO2 26 27 28  GLUCOSE 104* 104* 128*  BUN 21 20 23  CREATININE 1.69* 1.59* 1.57*  CALCIUM 7.9* 8.1* 7.9*    PT/INR  Recent Labs  06/21/14 0010  LABPROT 14.9  INR 1.15   Lipid Panel     Component Value Date/Time   CHOL 176 06/20/2014 0316   TRIG 58 06/20/2014 0316   HDL 41 06/20/2014 0316   CHOLHDL 4.3 06/20/2014 0316   VLDL 12 06/20/2014 0316   LDLCALC 123* 06/20/2014 0316    Cardiac Panel (last 3 results)  Recent Labs  06/20/14 0118 06/20/14 0316 06/20/14 1120  TROPONINI 0.27* 0.26* 0.15*    Studies/Results: 2D Echo 06/20/14 Study Conclusions  - Left ventricle: The cavity size was normal. Wall thickness was at the upper limits of normal. Systolic function was moderately reduced. The estimated ejection fraction was in the range of 35% to 40%. Diffuse hypokinesis. Global lateral strain: -12.0% - Aortic valve: There was mild regurgitation. - Aorta: Aortic root dimension: 39 mm (ED). - Ascending aorta: The ascending aorta was mildly dilated. - Mitral valve: There was mild regurgitation. - Left atrium: The atrium was mildly dilated. - Right ventricle: The cavity size was mildly dilated. Wall thickness was normal. - Right atrium: The atrium was moderately dilated. - Pulmonary arteries: Systolic pressure was mildly increased. PA peak pressure: 37 mm Hg (S).    Assessment/Plan  Principal Problem:   Acute CHF Active Problems:   Tobacco abuse   Essential hypertension   Hypertensive emergency   Elevated troponin   Leukocytosis   1. Acute Systolic CHF: EF 35-40% on echo. No dyspnea. Trace LEE on exam. Plan for LHC today to assess for CAD. Continue Coreg. Hold start of ACE/ARB until renal function improves. Lasix also temporarily on hold for cath.    2. Elevated troponins: minimally elevated w/ flat trend in the setting of CHF, however with severe LV dysfunction will plan for cath today.  3. HTN: BP controlled. Continue Coreg.  4. Renal Insufficiency: Scr 1.57 today. Continue to hold lasix. F/u BMP in the am following cath. No ACE/ARB.   5. Tobacco  abuse: smoking cessation strongly advised. Pt denies any cravings. Nicotine patch available if needed.   6. HLD: LDL elevated at 123. Continue high dose statin therapy with Lipitor, 80 mg. Recheck fasting lipid and LFTs in 6 weeks.      LOS: 2 days    Mance Vallejo M. Kimothy Kishimoto, PA-C 06/21/2014 9:25 AM   

## 2014-06-21 NOTE — Care Management Note (Signed)
    Page 1 of 1   06/24/2014     12:06:55 PM CARE MANAGEMENT NOTE 06/24/2014  Patient:  Wesley Riley, Wesley Riley   Account Number:  [id number]  Date Initiated:  06/21/2014  Documentation initiated by:  GRAVES-BIGELOW,Fallou Hulbert  Subjective/Objective Assessment:   Pt admitted for cp-Nstemi. Pt lives in [place] and has PCP. Pt is without insurance @ this time. Artist notified for assistance for bill and possible medicaid.     Action/Plan:   CM did discuss with pt in regards to possible [facility] services once stable for d/c. Plan for cath today and will continue to monitor. Pt has Scale and bp cuff at home.   Anticipated DC Date:  06/23/2014   Anticipated DC Plan:  HOME/SELF CARE  In-house referral  Financial Counselor      DC Planning Services  CM consult      Choice offered to / List presented to:             Status of service:  Completed, signed off Medicare Important Message given?  NO (If response is "NO", the following Medicare IM given date fields will be blank) Date Medicare IM given:   Medicare IM given by:   Date Additional Medicare IM given:   Additional Medicare IM given by:    Discharge Disposition:  HOME/SELF CARE  Per UR Regulation:  Reviewed for med. necessity/level of care/duration of stay  If discussed at Long Length of Stay Meetings, dates discussed:   06/24/2014    Comments:

## 2014-06-21 NOTE — Interval H&P Note (Signed)
History and Physical Interval Note:  06/21/2014 5:56 PM  Wesley Riley  has presented today for cardiac cath with the diagnosis of NSTEMI, chf. The various methods of treatment have been discussed with the patient and family. After consideration of risks, benefits and other options for treatment, the patient has consented to  Procedure(s): LEFT HEART CATHETERIZATION WITH CORONARY ANGIOGRAM (N/A) as a surgical intervention .  The patient's history has been reviewed, patient examined, no change in status, stable for surgery.  I have reviewed the patient's chart and labs.  Questions were answered to the patient's satisfaction.    Cath Lab Visit (complete for each Cath Lab visit)  Clinical Evaluation Leading to the Procedure:   ACS: Yes.    Non-ACS:    Anginal Classification: CCS III  Anti-ischemic medical therapy: No Therapy  Non-Invasive Test Results: No non-invasive testing performed  Prior CABG: No previous CABG         MCALHANY,CHRISTOPHER

## 2014-06-21 NOTE — CV Procedure (Signed)
      Cardiac Catheterization Operative Report  Wesley Riley [id number] 2/29/20166:10 PM [name], DO  Procedure Performed:  1. Left Heart Catheterization 2. Selective Coronary Angiography  Operator: Verne Carrow, MD  Arterial access site:  Right radial artery.   Indication:  64 yo male with history of tobacco abuse, HTN, medical non-compliance admitted with CHF, chest pressure. Mild troponin elevation. Renal function is abnormal (creatinine 1.57) so plans for diagnostic only cardiac cath today to exclude CAD.                                      Procedure Details: The risks, benefits, complications, treatment options, and expected outcomes were discussed with the patient. The patient and/or family concurred with the proposed plan, giving informed consent. The patient was brought to the cath lab after IV hydration was begun and oral premedication was given. The patient was further sedated with Versed and Fentanyl. The right wrist was assessed with a modified Allens test which was positive. The right wrist was prepped and draped in a sterile fashion. 1% lidocaine was used for local anesthesia. Using the modified Seldinger access technique, a 5 French sheath was placed in the right radial artery. 3 mg Verapamil was given through the sheath. 4000 units IV heparin was given. Standard diagnostic catheters were used to perform selective coronary angiography. A pigtail catheter was used to cross the aortic valve into the LV. Pressures measured but no LV gram performed. The sheath was removed from the right radial artery and a Terumo hemostasis band was applied at the arteriotomy site on the right wrist.    There were no immediate complications. The patient was taken to the recovery area in stable condition.   Hemodynamic Findings: Central aortic pressure: 137/91 Left ventricular pressure: 141/19/32  Angiographic Findings:  Left main: No obstructive disease.   Left Anterior  Descending Artery: Very large caliber vessel that courses to the apex. No obstructive disease. There is a large intermediate branch that gives off several vessels to the lateral wall.   Circumflex Artery: There is a small vessel in this distribution that arises from the intermediate branch.   Right Coronary Artery: Very large caliber, dominant vessel with no obstructive disease.   Left Ventricular Angiogram: Deferred.   Impression: 1. No angiographic evidence of CAD 2. Non-ischemic cardiomyopathy 3. Elevated filling pressures  Recommendations: Will resume IV Lasix. Titration of CHF meds as renal function tolerates.        Complications:  None. The patient tolerated the procedure well.

## 2014-06-21 NOTE — Progress Notes (Signed)
ANTICOAGULATION CONSULT NOTE - Follow-up Consult  Pharmacy Consult for Heparin Indication: chest pain/ACS  Allergies  Allergen Reactions  . Penicillins Hives and Rash    Patient Measurements: Height: 5' 9.5" (176.5 cm) Weight: 177 lb 1.6 oz (80.332 kg) IBW/kg (Calculated) : 71.85  Vital Signs: Temp: 98.3 F (36.8 C) (02/29 0738) Temp Source: Oral (02/29 0738) BP: 129/85 mmHg (02/29 0738) Pulse Rate: 80 (02/29 0738)  Labs:  Recent Labs  06/20/14 0118 06/20/14 0316 06/20/14 1120 06/20/14 1657 06/21/14 0010 06/21/14 0835  HGB 12.1* 12.4*  --   --  11.8*  --   HCT 35.8* 36.5*  --   --  34.7*  --   PLT 126* 115*  --   --  114*  --   LABPROT  --   --   --   --  14.9  --   INR  --   --   --   --  1.15  --   HEPARINUNFRC  --   --   --  0.18* 0.36 0.20*  CREATININE 1.69* 1.59*  --   --  1.57*  --   TROPONINI 0.27* 0.26* 0.15*  --   --   --     Estimated Creatinine Clearance: 49 mL/min (by C-G formula based on Cr of 1.57).   Assessment: 64 yo M on heparin for ACS/NSTEMI. Heparin level subtherapeutic at 0.2 on 1150 units/hr. No bleeding noted, Hb is stable, platelets are low stable. Cath planned today.   Goal of Therapy:  Heparin level 0.3-0.7 units/ml Monitor platelets by anticoagulation protocol: Yes   Plan:  - Heparin 1200 units IV bolus then increase rate to 1350 units/hr - Daily heparin level and CBC - F/U after cath or repeat heparin level - Monitor for s/sx of bleeding  [facility], [place].D., BCPS Clinical Pharmacist Pager: [phone] 06/21/2014 11:13 AM

## 2014-06-21 NOTE — Progress Notes (Signed)
ANTICOAGULATION CONSULT NOTE - Follow Up Consult  Pharmacy Consult for heparin Indication: NSTEMI  Labs:  Recent Labs  06/20/14 0118 06/20/14 0316 06/20/14 1120 06/20/14 1657 06/21/14 0010  HGB 12.1* 12.4*  --   --  11.8*  HCT 35.8* 36.5*  --   --  34.7*  PLT 126* 115*  --   --  114*  HEPARINUNFRC  --   --   --  0.18* 0.36  CREATININE 1.69* 1.59*  --   --   --   TROPONINI 0.27* 0.26* 0.15*  --   --      Assessment/Plan:  64yo male therapeutic on heparin after rate increase. Will continue gtt at current rate and confirm stable with additional level.   Vernard Gambles, PharmD, BCPS  06/21/2014,12:51 AM

## 2014-06-21 NOTE — H&P (View-Only) (Signed)
Patient Profile: 64 year old male with history of tobacco abuse, CKD stage III, hypertension admitted for acute HF. Also with elevated troponin. High suspicion for CAD.   Subjective: Currently CP free. No dyspnea. He has concerns regarding his taking Coreg. States he had unwanted side effects in the past with metoprolol (nausea + dizziness).   Objective: Vital signs in last 24 hours: Temp:  [97.5 F (36.4 C)-98.9 F (37.2 C)] 98.3 F (36.8 C) (02/29 0738) Pulse Rate:  [74-89] 80 (02/29 0738) Resp:  [18-20] 18 (02/29 0738) BP: (117-137)/(78-88) 129/85 mmHg (02/29 0738) SpO2:  [92 %-95 %] 94 % (02/29 0738) Weight:  [177 lb 1.6 oz (80.332 kg)] 177 lb 1.6 oz (80.332 kg) (02/29 0400) Last BM Date: 06/19/14  Intake/Output from previous day: 02/28 0701 - 02/29 0700 In: 1193 [P.O.:1190; I.V.:3] Out: 950 [Urine:950] Intake/Output this shift:    Medications Current Facility-Administered Medications  Medication Dose Route Frequency Provider Last Rate Last Dose  . 0.9 %  sodium chloride infusion  250 mL Intravenous PRN Lorretta Harp, MD 10 mL/hr at 06/20/14 0745 250 mL at 06/20/14 0745  . 0.9 %  sodium chloride infusion  250 mL Intravenous PRN Dolores Patty, MD      . 0.9 %  sodium chloride infusion  1 mL/kg/hr Intravenous Continuous Dolores Patty, MD 79.8 mL/hr at 06/21/14 0436 1 mL/kg/hr at 06/21/14 0436  . acetaminophen (TYLENOL) tablet 650 mg  650 mg Oral Q4H PRN Lorretta Harp, MD      . amLODipine (NORVASC) tablet 5 mg  5 mg Oral Daily Lorretta Harp, MD   5 mg at 06/20/14 8676  . antiseptic oral rinse (CPC / CETYLPYRIDINIUM CHLORIDE 0.05%) solution 7 mL  7 mL Mouth Rinse BID Lorretta Harp, MD   7 mL at 06/20/14 0931  . aspirin EC tablet 81 mg  81 mg Oral Daily Lorretta Harp, MD   81 mg at 06/20/14 1950  . atorvastatin (LIPITOR) tablet 80 mg  80 mg Oral q1800 Renae Fickle, MD   80 mg at 06/20/14 1728  . carvedilol (COREG) tablet 3.125 mg  3.125 mg Oral BID WC Renae Fickle, MD   3.125 mg at  06/20/14 1728  . heparin ADULT infusion 100 units/mL (25000 units/250 mL)  1,150 Units/hr Intravenous Continuous Hilario Quarry Amend, RPH 11.5 mL/hr at 06/20/14 1748 1,150 Units/hr at 06/20/14 1748  . hydrALAZINE (APRESOLINE) injection 5 mg  5 mg Intravenous Q2H PRN Lorretta Harp, MD      . morphine 2 MG/ML injection 2 mg  2 mg Intravenous Q3H PRN Lorretta Harp, MD      . nicotine (NICODERM CQ - dosed in mg/24 hours) patch 21 mg  21 mg Transdermal Daily Lorretta Harp, MD   21 mg at 06/20/14 9326  . nitroGLYCERIN (NITRODUR - Dosed in mg/24 hr) patch 0.2 mg  0.2 mg Transdermal Daily Lorretta Harp, MD   0.2 mg at 06/20/14 1137  . ondansetron (ZOFRAN) injection 4 mg  4 mg Intravenous Q6H PRN Lorretta Harp, MD   4 mg at 06/20/14 2031  . potassium chloride SA (K-DUR,KLOR-CON) CR tablet 40 mEq  40 mEq Oral Daily Renae Fickle, MD      . sodium chloride 0.9 % injection 3 mL  3 mL Intravenous Q12H Lorretta Harp, MD   3 mL at 06/20/14 1133  . sodium chloride 0.9 % injection 3 mL  3 mL Intravenous PRN Lorretta Harp, MD      . sodium chloride 0.9 % injection 3 mL  3 mL Intravenous Q12H Dolores Patty, MD   0 mL at 06/20/14 2200  . sodium chloride 0.9 % injection 3 mL  3 mL Intravenous PRN Dolores Patty, MD        PE: General appearance: alert, cooperative and no distress Neck: no carotid bruit and no JVD Lungs: clear to auscultation bilaterally Heart: regular rate and rhythm, S1, S2 normal, no murmur, click, rub or gallop Extremities: trace LEE Pulses: 2+ and symmetric Skin: warm and dry Neurologic: Grossly normal  Lab Results:   Recent Labs  06/20/14 0118 06/20/14 0316 06/21/14 0010  WBC 10.4 9.0 7.5  HGB 12.1* 12.4* 11.8*  HCT 35.8* 36.5* 34.7*  PLT 126* 115* 114*   BMET  Recent Labs  06/20/14 0118 06/20/14 0316 06/21/14 0010  NA 139 140 138  K 3.1* 3.0* 3.5  CL 104 105 106  CO2 GLUCOSE 104* 104* 128*  BUN CREATININE 1.69* 1.59* 1.57*  CALCIUM 7.9* 8.1* 7.9*    PT/INR  Recent Labs  06/21/14 0010  LABPROT 14.9  INR 1.15   Lipid Panel     Component Value Date/Time   CHOL 176 06/20/2014 0316   TRIG 58 06/20/2014 0316   HDL 41 06/20/2014 0316   CHOLHDL 4.3 06/20/2014 0316   VLDL 12 06/20/2014 0316   LDLCALC 123* 06/20/2014 0316    Cardiac Panel (last 3 results)  Recent Labs  06/20/14 0118 06/20/14 0316 06/20/14 1120  TROPONINI 0.27* 0.26* 0.15*    Studies/Results: 2D Echo 06/20/14 Study Conclusions  - Left ventricle: The cavity size was normal. Wall thickness was at the upper limits of normal. Systolic function was moderately reduced. The estimated ejection fraction was in the range of 35% to 40%. Diffuse hypokinesis. Global lateral strain: -12.0% - Aortic valve: There was mild regurgitation. - Aorta: Aortic root dimension: 39 mm (ED). - Ascending aorta: The ascending aorta was mildly dilated. - Mitral valve: There was mild regurgitation. - Left atrium: The atrium was mildly dilated. - Right ventricle: The cavity size was mildly dilated. Wall thickness was normal. - Right atrium: The atrium was moderately dilated. - Pulmonary arteries: Systolic pressure was mildly increased. PA peak pressure: 37 mm Hg (S).    Assessment/Plan  Principal Problem:   Acute CHF Active Problems:   Tobacco abuse   Essential hypertension   Hypertensive emergency   Elevated troponin   Leukocytosis   1. Acute Systolic CHF: EF 16-10% on echo. No dyspnea. Trace LEE on exam. Plan for LHC today to assess for CAD. Continue Coreg. Hold start of ACE/ARB until renal function improves. Lasix also temporarily on hold for cath.    2. Elevated troponins: minimally elevated w/ flat trend in the setting of CHF, however with severe LV dysfunction will plan for cath today.  3. HTN: BP controlled. Continue Coreg.  4. Renal Insufficiency: Scr 1.57 today. Continue to hold lasix. F/u BMP in the am following cath. No ACE/ARB.   5. Tobacco  abuse: smoking cessation strongly advised. Pt denies any cravings. Nicotine patch available if needed.   6. HLD: LDL elevated at 123. Continue high dose statin therapy with Lipitor, 80 mg. Recheck fasting lipid and LFTs in 6 weeks.      LOS: 2 days    Luanna Weesner M. Sharol Harness, PA-C 06/21/2014 9:25 AM

## 2014-06-22 DIAGNOSIS — R112 Nausea with vomiting, unspecified: Secondary | ICD-10-CM

## 2014-06-22 DIAGNOSIS — R42 Dizziness and giddiness: Secondary | ICD-10-CM

## 2014-06-22 DIAGNOSIS — I5021 Acute systolic (congestive) heart failure: Principal | ICD-10-CM

## 2014-06-22 DIAGNOSIS — N179 Acute kidney failure, unspecified: Secondary | ICD-10-CM

## 2014-06-22 DIAGNOSIS — R7989 Other specified abnormal findings of blood chemistry: Secondary | ICD-10-CM

## 2014-06-22 DIAGNOSIS — I509 Heart failure, unspecified: Secondary | ICD-10-CM | POA: Insufficient documentation

## 2014-06-22 LAB — FOLATE RBC
FOLATE, RBC: 1040 ng/mL (ref >498)
Folate, Hemolysate: 369.2 ng/mL
Hematocrit: 35.5 % — ABNORMAL LOW (ref 37.5–51.0)

## 2014-06-22 LAB — BASIC METABOLIC PANEL
ANION GAP: 8 (ref 5–15)
BUN: 22 mg/dL (ref 6–23)
CHLORIDE: 109 mmol/L (ref 96–112)
CO2: 26 mmol/L (ref 19–32)
CREATININE: 1.42 mg/dL — AB (ref 0.50–1.35)
Calcium: 8.3 mg/dL — ABNORMAL LOW (ref 8.4–10.5)
GFR calc Af Amer: 59 mL/min — ABNORMAL LOW (ref >90)
GFR calc non Af Amer: 51 mL/min — ABNORMAL LOW (ref >90)
Glucose, Bld: 94 mg/dL (ref 70–99)
Potassium: 3.7 mmol/L (ref 3.5–5.1)
SODIUM: 143 mmol/L (ref 135–145)

## 2014-06-22 LAB — CBC
HCT: 34.3 % — ABNORMAL LOW (ref 39.0–52.0)
Hemoglobin: 11.4 g/dL — ABNORMAL LOW (ref 13.0–17.0)
MCH: 32.3 pg (ref 26.0–34.0)
MCHC: 33.2 g/dL (ref 30.0–36.0)
MCV: 97.2 fL (ref 78.0–100.0)
PLATELETS: 111 10*3/uL — AB (ref 150–400)
RBC: 3.53 MIL/uL — ABNORMAL LOW (ref 4.22–5.81)
RDW: 13.8 % (ref 11.5–15.5)
WBC: 7.4 10*3/uL (ref 4.0–10.5)

## 2014-06-22 LAB — TRANSFERRIN: Transferrin: 165 mg/dL — ABNORMAL LOW (ref 200–370)

## 2014-06-22 LAB — MAGNESIUM: Magnesium: 1.8 mg/dL (ref 1.5–2.5)

## 2014-06-22 MED ORDER — PANTOPRAZOLE SODIUM 40 MG PO TBEC
40.0000 mg | DELAYED_RELEASE_TABLET | Freq: Every day | ORAL | Status: DC
  Administered 2014-06-22 – 2014-06-24 (×3): 40 mg via ORAL
  Filled 2014-06-22 (×3): qty 1

## 2014-06-22 MED ORDER — DOCUSATE SODIUM 100 MG PO CAPS
100.0000 mg | ORAL_CAPSULE | Freq: Two times a day (BID) | ORAL | Status: DC
  Filled 2014-06-22 (×3): qty 1

## 2014-06-22 MED ORDER — SENNA 8.6 MG PO TABS
2.0000 | ORAL_TABLET | Freq: Every day | ORAL | Status: DC
  Filled 2014-06-22 (×2): qty 2

## 2014-06-22 MED ORDER — SPIRONOLACTONE 25 MG PO TABS
12.5000 mg | ORAL_TABLET | Freq: Every day | ORAL | Status: DC
  Administered 2014-06-22 – 2014-06-24 (×3): 12.5 mg via ORAL
  Filled 2014-06-22 (×3): qty 1

## 2014-06-22 MED ORDER — SIMVASTATIN 20 MG PO TABS
20.0000 mg | ORAL_TABLET | Freq: Every day | ORAL | Status: DC
  Administered 2014-06-22 – 2014-06-23 (×2): 20 mg via ORAL
  Filled 2014-06-22 (×2): qty 1

## 2014-06-22 MED ORDER — ONDANSETRON HCL 4 MG PO TABS: 4.0000 mg | ORAL_TABLET | Freq: Four times a day (QID) | ORAL | Status: DC | PRN

## 2014-06-22 MED ORDER — ALUM & MAG HYDROXIDE-SIMETH 200-200-20 MG/5ML PO SUSP
30.0000 mL | ORAL | Status: DC | PRN
  Administered 2014-06-22: 30 mL via ORAL
  Filled 2014-06-22: qty 30

## 2014-06-22 NOTE — Progress Notes (Signed)
[facility] PROGRESS NOTE  Wesley Riley AVW:[id number] DOB: [birth year] DOA: 06/19/2014 PCP: [name], DO  Brief summary  64 year old male with history of tobacco abuse, hypertension, medical noncompliance who presented with shortness of breath. He had not been taking his medication for over a year. He developed shortness of breath proximally 4 weeks prior to presentation and despite resuming his blood pressure medications he continued to have worsening shortness of breath, cough, chest pressure.  CHest pressure occurred at rest while working on his computer and was associated with SOB.  Not exertion-related tightness.  Has had weight loss over the last few years and denied lower extremity edema.  He was seen in [facility] and was transferred to [facility] because he had pulmonary edema, elevated BNP and a troponin of 0.53.  ECHo demonstrated diffuse hypokinesis with ejection fraction of 35-40%.  Initially this was assumed to be ischemic cardiomyopathy given his elevated troponin, however, cardiac catheterization on 2/29 demonstrated no evidence of CAD but elevated filling pressures.  Ongoing diuresis.    Assessment/Plan  Acute systolic congestive heart failure due to NICM possibly due to uncontrolled HTN  - Continue ASA, statin, BB - Started on spironolactone  - d/c NTG patch - no ACEI 2/2 kidney function until we have ruled out AKI - TSH wnl - A1c 5.8 -  Troponin peak at 0.53, trended down to 0.26 - IV lasix diuresis per cardiology -  Ferritin and TSH wnl, HIV NR -  Further work up for etiology per   Nausea may be related to heart failure exacerbation vs. GERD -  LFTs wnl  -  Lipase -  zofran before meals -  Start PPI  Dizziness, particularly when looking up -  Carotid duplex with ultrasound of vertebral arteries  HTN, mildly elevated -  BB, norvasc  V-tach -  Continue beta blocker, low dose -  Keep K and Mag wnl  Acute vs. Chronic kidney disease,  probably CKD but creatinine gradually improving.   -  RUS:  normal -  Trend creatinine:  Approximately stable -  Renally dose medications and minimize nephrotoxins  Leukocytosis: Likely due to stress, resolved. No evidence of underlying infection  Tobacco abuse: Smokes 1 pack day for more than 40 years. -Nicotine patch declined  -Counseled pt about importance of quitting smoking.  Normocytic anemia and thrombocytopenia, blood counts stable -  Iron studies suggest iron deficiency -  Start iron supplementation -   b12 429, folate pending -  HIV NR -  TSH wnl -  Occult stool neg -  Needs GI follow up  Diet:  Healthy heart Access:  PIV IVF:  off Proph:  scd  Code Status: full Family Communication: patient and wife Disposition Plan:  Pending further diuresis  Consultants:  Cardiology  Procedures:  ECHO  RUS  Antibiotics:  none   HPI/Subjective:  Continuous have some nausea and dizziness when he looks up or when he exerts himself. States the tightness in his chest and abdomen is slowly improving. Denies lower extremity edema.  Objective: Filed Vitals:   06/21/14 2315 06/21/14 2316 06/21/14 2346 06/22/14 0400  BP: 142/88   124/85  Pulse:  88  73  Temp:   97.9 F (36.6 C) 99 F (37.2 C)  TempSrc:   Oral Oral  Resp:  23  22  Height:      Weight:    80.559 kg (177 lb 9.6 oz)  SpO2:  92%  94%    Intake/Output Summary (  Last 24 hours) at 06/22/14 1153 Last data filed at 06/22/14 1000  Gross per 24 hour  Intake 1793.17 ml  Output   2475 ml  Net -681.83 ml   Filed Weights   06/20/14 0421 06/21/14 0400 06/22/14 0400  Weight: 79.788 kg (175 lb 14.4 oz) 80.332 kg (177 lb 1.6 oz) 80.559 kg (177 lb 9.6 oz)    Exam:   General:  WM, No acute distress  HEENT:  NCAT, MMM  Cardiovascular:  RRR, nl S1, S2, + gallop, 2+ pulses, warm extremities  Respiratory:  Rales at the bilateral bases to the mid back, no wheezes, no rhonchi, no increased WOB  Abdomen:    NABS, soft, mildly distended, mild tenderness in the epigastric area without rebound or guarding  MSK:   Normal tone and bulk, no LEE  Neuro:  Grossly intact  Data Reviewed: Basic Metabolic Panel:  Recent Labs Lab 06/20/14 0118 06/20/14 0316 06/21/14 0010 06/22/14 0343  NA 139 140 138 143  K 3.1* 3.0* 3.5 3.7  CL 104 105 106 109  CO2 GLUCOSE 104* 104* 128* 94  BUN CREATININE 1.69* 1.59* 1.57* 1.42*  CALCIUM 7.9* 8.1* 7.9* 8.3*  MG  --  1.6 2.1 1.8   Liver Function Tests:  Recent Labs Lab 06/20/14 0118  AST 17  ALT 13  ALKPHOS 62  BILITOT 0.6  PROT 5.6*  ALBUMIN 3.0*   No results for input(s): LIPASE, AMYLASE in the last 168 hours. No results for input(s): AMMONIA in the last 168 hours. CBC:  Recent Labs Lab 06/20/14 0118 06/20/14 0316 06/21/14 0010 06/22/14 0343  WBC 10.4 9.0 7.5 7.4  NEUTROABS 6.6  --   --   --   HGB 12.1* 12.4* 11.8* 11.4*  HCT 35.8* 36.5* 34.7* 34.3*  MCV 96.2 97.3 97.5 97.2  PLT 126* 115* 114* 111*   Cardiac Enzymes:  Recent Labs Lab 06/20/14 0118 06/20/14 0316 06/20/14 1120  TROPONINI 0.27* 0.26* 0.15*   BNP (last 3 results)  Recent Labs  06/20/14 0118  BNP 994.8*    ProBNP (last 3 results) No results for input(s): PROBNP in the last 8760 hours.  CBG:  Recent Labs Lab 06/19/14 2226  GLUCAP 84    Recent Results (from the past 240 hour(s))  MRSA PCR Screening     Status: None   Collection Time: 06/19/14  9:35 PM  Result Value Ref Range Status   MRSA by PCR NEGATIVE NEGATIVE Final    Comment:        The GeneXpert MRSA Assay (FDA approved for NASAL specimens only), is one component of a comprehensive MRSA colonization surveillance program. It is not intended to diagnose MRSA infection nor to guide or monitor treatment for MRSA infections.      Studies: Dg Chest Port 1 View  06/21/2014   CLINICAL DATA:  Dyspnea.  Pulmonary edema.  EXAM: PORTABLE CHEST - 1 VIEW  COMPARISON:   06/19/2014  FINDINGS: There is marked improvement of the pulmonary edema with slight residual edema at the right lung base. There is minimal residual atelectasis at the left lung base.  Heart size and pulmonary vascularity are normal.  No acute osseous abnormality.  IMPRESSION: Almost complete resolution of pulmonary edema.   Electronically Signed   By: Francene Boyers M.D.   On: 06/21/2014 07:46    Scheduled Meds: . amLODipine  5 mg Oral Daily  . antiseptic oral rinse  7 mL Mouth  Rinse BID  . aspirin EC  81 mg Oral Daily  . atorvastatin  80 mg Oral q1800  . carvedilol  3.125 mg Oral BID WC  . ferrous sulfate  325 mg Oral BID WC  . furosemide  40 mg Intravenous Q12H  . potassium chloride  40 mEq Oral Daily  . sodium chloride  3 mL Intravenous Q12H  . spironolactone  12.5 mg Oral Daily   Continuous Infusions:    Principal Problem:   Acute CHF Active Problems:   Tobacco abuse   Essential hypertension   Hypertensive emergency   Elevated troponin   Leukocytosis   Congestive heart disease    Time spent: 30 min    Jamier Urbas, [facility]  [facility] Pager [phone]. If 7PM-7AM, please contact night-coverage at [email or web address], password [facility] 06/22/2014, 11:53 AM  LOS: 3 days

## 2014-06-22 NOTE — Progress Notes (Signed)
Patient Profile: 64 year old male with history of tobacco abuse, CKD stage III, hypertension admitted for acute HF. Also with elevated troponin. High suspicion for CAD.   Subjective: Breathing improved. No resting dyspnea.    Objective: Vital signs in last 24 hours: Temp:  [97.8 F (36.6 C)-99 F (37.2 C)] 99 F (37.2 C) (03/01 0400) Pulse Rate:  [61-93] 73 (03/01 0400) Resp:  [16-26] 22 (03/01 0400) BP: (124-159)/(74-112) 124/85 mmHg (03/01 0400) SpO2:  [92 %-97 %] 94 % (03/01 0400) Weight:  [177 lb 9.6 oz (80.559 kg)] 177 lb 9.6 oz (80.559 kg) (03/01 0400) Last BM Date: 06/21/14  Intake/Output from previous day: 02/29 0701 - 03/01 0700 In: 1553.2 [I.V.:1553.2] Out: 2175 [Urine:2175] Intake/Output this shift: Total I/O In: 240 [P.O.:240] Out: -   Medications Current Facility-Administered Medications  Medication Dose Route Frequency Provider Last Rate Last Dose  . 0.9 %  sodium chloride infusion  250 mL Intravenous PRN Lorretta Harp, MD 10 mL/hr at 06/20/14 0745 250 mL at 06/20/14 0745  . acetaminophen (TYLENOL) tablet 650 mg  650 mg Oral Q4H PRN Lorretta Harp, MD      . amLODipine (NORVASC) tablet 5 mg  5 mg Oral Daily Lorretta Harp, MD   5 mg at 06/22/14 0846  . antiseptic oral rinse (CPC / CETYLPYRIDINIUM CHLORIDE 0.05%) solution 7 mL  7 mL Mouth Rinse BID Lorretta Harp, MD   7 mL at 06/22/14 0846  . aspirin EC tablet 81 mg  81 mg Oral Daily Lorretta Harp, MD   81 mg at 06/22/14 0843  . atorvastatin (LIPITOR) tablet 80 mg  80 mg Oral q1800 Renae Fickle, MD   80 mg at 06/20/14 1728  . carvedilol (COREG) tablet 3.125 mg  3.125 mg Oral BID WC Renae Fickle, MD   3.125 mg at 06/22/14 0845  . ferrous sulfate tablet 325 mg  325 mg Oral BID WC Renae Fickle, MD   325 mg at 06/22/14 0843  . furosemide (LASIX) injection 40 mg  40 mg Intravenous Q12H Kathleene Hazel, MD   40 mg at 06/22/14 0750  . hydrALAZINE (APRESOLINE) injection 5 mg  5 mg Intravenous Q2H PRN Lorretta Harp, MD      .  morphine 2 MG/ML injection 2 mg  2 mg Intravenous Q3H PRN Lorretta Harp, MD      . ondansetron [facility]) injection 4 mg  4 mg Intravenous Q6H PRN Lorretta Harp, MD   4 mg at 06/22/14 0751  . potassium chloride SA (K-DUR,KLOR-CON) CR tablet 40 mEq  40 mEq Oral Daily Renae Fickle, MD   40 mEq at 06/22/14 0844  . sodium chloride 0.9 % injection 3 mL  3 mL Intravenous Q12H Lorretta Harp, MD   3 mL at 06/22/14 0753  . sodium chloride 0.9 % injection 3 mL  3 mL Intravenous PRN Lorretta Harp, MD        PE: General appearance: alert, cooperative and no distress Neck: no carotid bruit and no JVD Lungs: faint bibasilar rales Heart: regular rate and rhythm, S1, S2 normal, no murmur, click, rub or gallop Extremities: trace LEE Pulses: 2+ and symmetric Skin: warm and dry Neurologic: Grossly normal  Lab Results:   Recent Labs  06/20/14 0316 06/21/14 0010 06/22/14 0343  WBC 9.0 7.5 7.4  HGB 12.4* 11.8* 11.4*  HCT 36.5* 34.7* 34.3*  PLT 115* 114* 111*   BMET  Recent Labs  06/20/14 0316 06/21/14 0010 06/22/14 0343  NA 140 138 143  K 3.0* 3.5 3.7  CL 105 106 109  CO2 27 28 26   GLUCOSE 104* 128* 94  BUN 20 23 22   CREATININE 1.59* 1.57* 1.42*  CALCIUM 8.1* 7.9* 8.3*   PT/INR  Recent Labs  06/21/14 0010  LABPROT 14.9  INR 1.15   Lipid Panel     Component Value Date/Time   CHOL 176 06/20/2014 0316   TRIG 58 06/20/2014 0316   HDL 41 06/20/2014 0316   CHOLHDL 4.3 06/20/2014 0316   VLDL 12 06/20/2014 0316   LDLCALC 123* 06/20/2014 0316    Cardiac Panel (last 3 results)  Recent Labs  06/20/14 0118 06/20/14 0316 06/20/14 1120  TROPONINI 0.27* 0.26* 0.15*    Studies/Results: 2D Echo 06/20/14 Study Conclusions  - Left ventricle: The cavity size was normal. Wall thickness was at the upper limits of normal. Systolic function was moderately reduced. The estimated ejection fraction was in the range of 35% to 40%. Diffuse hypokinesis. Global lateral strain: -12.0% - Aortic  valve: There was mild regurgitation. - Aorta: Aortic root dimension: 39 mm (ED). - Ascending aorta: The ascending aorta was mildly dilated. - Mitral valve: There was mild regurgitation. - Left atrium: The atrium was mildly dilated. - Right ventricle: The cavity size was mildly dilated. Wall thickness was normal. - Right atrium: The atrium was moderately dilated. - Pulmonary arteries: Systolic pressure was mildly increased. PA peak pressure: 37 mm Hg (S).  LHC 06/21/14 Hemodynamic Findings: Central aortic pressure: 137/91 Left ventricular pressure: 141/19/32  Angiographic Findings:  Left main: No obstructive disease.   Left Anterior Descending Artery: Very large caliber vessel that courses to the apex. No obstructive disease. There is a large intermediate branch that gives off several vessels to the lateral wall.   Circumflex Artery: There is a small vessel in this distribution that arises from the intermediate branch.   Right Coronary Artery: Very large caliber, dominant vessel with no obstructive disease.   Left Ventricular Angiogram: Deferred.   Impression: 1. No angiographic evidence of CAD 2. Non-ischemic cardiomyopathy 3. Elevated filling pressures  Assessment/Plan  Principal Problem:   Acute CHF Active Problems:   Tobacco abuse   Essential hypertension   Hypertensive emergency   Elevated troponin   Leukocytosis   Congestive heart disease   1. Acute Systolic CHF: EF 23-36% on echo. No resting dyspnea but still with bibasilar rales. Continue IV Lasix today and possible conversion to PO in the am. Continue BB.   2. Non-ischemic Cardiomyopathy: LHC w/o angiographic evidence of CAD. Continue medical therapy. Resume BB therapy with Coreg. Add ACE/ARB when renal function improves.   3. Elevated troponins: minimally elevated w/ flat trend in the setting of CHF. No CAD on cath.  4. HTN: BP controlled. Continue Coreg and amlodipine. Add ACE/ARB for HF once renal  function improves.   4. Renal Insufficiency: Scr 1.42 today. Lasix resumed. Hold  ACE/ARB for now. F/u BMP in the am.   5. Tobacco abuse: smoking cessation strongly advised. Pt denies any cravings. Nicotine patch available if needed.   6. HLD: LDL elevated at 123. Continue high dose statin therapy with Lipitor, 80 mg. Recheck fasting lipid and LFTs in 6 weeks.      LOS: 3 days    Kamoni Depree M. Sharol Harness, PA-C 06/22/2014 9:19 AM

## 2014-06-22 NOTE — Progress Notes (Signed)
Pt ambulated in hall with nurse, 02 sat 94% room air before ambulation, 93% room after ambulation, 02 levels dropped slightly when speaking and mouth breathing 89%, presently sitting in chair at bedside, room air 02 sat 92 - 95%

## 2014-06-23 DIAGNOSIS — R0600 Dyspnea, unspecified: Secondary | ICD-10-CM

## 2014-06-23 DIAGNOSIS — R42 Dizziness and giddiness: Secondary | ICD-10-CM

## 2014-06-23 LAB — BASIC METABOLIC PANEL
ANION GAP: 8 (ref 5–15)
BUN: 19 mg/dL (ref 6–23)
CALCIUM: 8.5 mg/dL (ref 8.4–10.5)
CO2: 28 mmol/L (ref 19–32)
Chloride: 105 mmol/L (ref 96–112)
Creatinine, Ser: 1.48 mg/dL — ABNORMAL HIGH (ref 0.50–1.35)
GFR, EST AFRICAN AMERICAN: 56 mL/min — AB (ref >90)
GFR, EST NON AFRICAN AMERICAN: 49 mL/min — AB (ref >90)
Glucose, Bld: 110 mg/dL — ABNORMAL HIGH (ref 70–99)
POTASSIUM: 3.6 mmol/L (ref 3.5–5.1)
SODIUM: 141 mmol/L (ref 135–145)

## 2014-06-23 LAB — LIPASE, BLOOD: Lipase: 24 U/L (ref 11–59)

## 2014-06-23 MED ORDER — ISOSORBIDE MONONITRATE ER 30 MG PO TB24
15.0000 mg | ORAL_TABLET | Freq: Every day | ORAL | Status: DC
  Administered 2014-06-23 – 2014-06-24 (×2): 15 mg via ORAL
  Filled 2014-06-23 (×2): qty 1

## 2014-06-23 MED ORDER — HYDRALAZINE HCL 10 MG PO TABS
10.0000 mg | ORAL_TABLET | Freq: Three times a day (TID) | ORAL | Status: DC
Start: 2014-06-23 — End: 2014-06-24
  Administered 2014-06-23 – 2014-06-24 (×3): 10 mg via ORAL
  Filled 2014-06-23 (×3): qty 1

## 2014-06-23 MED ORDER — FUROSEMIDE 40 MG PO TABS
40.0000 mg | ORAL_TABLET | Freq: Two times a day (BID) | ORAL | Status: DC
  Administered 2014-06-23 – 2014-06-24 (×2): 40 mg via ORAL
  Filled 2014-06-23 (×2): qty 1

## 2014-06-23 NOTE — Progress Notes (Signed)
[facility] PROGRESS NOTE  NEWT APODACA KHT:[id number] DOB: [birth year] DOA: 06/19/2014 PCP: [name], DO  Brief summary  64 year old male with history of tobacco abuse, hypertension, medical noncompliance who presented with shortness of breath. He had not been taking his medication for over a year. He developed shortness of breath proximally 4 weeks prior to presentation and despite resuming his blood pressure medications he continued to have worsening shortness of breath, cough, chest pressure.  CHest pressure occurred at rest while working on his computer and was associated with SOB.  Not exertion-related tightness.  Has had weight loss over the last few years and denied lower extremity edema.  He was seen in [facility] and was transferred to [facility] because he had pulmonary edema, elevated BNP and a troponin of 0.53.  ECHo demonstrated diffuse hypokinesis with ejection fraction of 35-40%.  Initially this was assumed to be ischemic cardiomyopathy given his elevated troponin, however, cardiac catheterization on 2/29 demonstrated no evidence of CAD but elevated filling pressures.  Ongoing diuresis.     HPI/Subjective:  Dizziness and nausea improved, but patient still taken Zofran.  Assessment/Plan  Acute systolic congestive heart failure due to NICM possibly due to uncontrolled HTN  - Continue ASA, statin, BB - Started on spironolactone  - TSH and A1c within normal limits. - Troponin peak at 0.53, cardiac cath was done and showed no evidence of CAD. - Appears to be euvolemic, Lasix switched to oral per cardiology.  Nausea may be related to heart failure exacerbation vs. GERD - Lipase and LFTs within normal limits. - Patient requests Zofran, PPI started.  Dizziness, particularly when looking up -  Carotid duplex with ultrasound of vertebral arteries  HTN, mildly elevated -  BB, norvasc  V-tach -  Continue beta blocker, low dose -  Keep K and Mag  wnl  Acute vs. Chronic kidney disease, probably CKD but creatinine gradually improving.   -  RUS:  normal -  Trend creatinine:  Approximately stable -  Renally dose medications and minimize nephrotoxins  Leukocytosis: Likely due to stress, resolved. No evidence of underlying infection  Tobacco abuse: Smokes 1 pack day for more than 40 years. -Nicotine patch declined  -Counseled pt about importance of quitting smoking.  Normocytic anemia and thrombocytopenia, blood counts stable -  Iron studies suggest iron deficiency -  Start iron supplementation -   b12 429, folate pending -  HIV NR -  TSH wnl -  Occult stool neg -  Needs GI follow up  Diet:  Healthy heart Access:  PIV IVF:  off Proph:  scd  Code Status: full Family Communication: patient and wife Disposition Plan:  Pending further diuresis  Consultants:  Cardiology  Procedures:  ECHO  RUS  Antibiotics:  none  s up or when he exerts himself. States the tightness in his chest and abdomen is slowly improving. Denies lower extremity edema.  Objective: Filed Vitals:   06/22/14 0400 06/22/14 1452 06/22/14 2050 06/23/14 0600  BP: 124/85 126/88 140/89 128/81  Pulse: 73 75 80 66  Temp: 99 F (37.2 C) 98.9 F (37.2 C) 98.6 F (37 C) 97.7 F (36.5 C)  TempSrc: Oral Oral Oral Oral  Resp: 22 21 20 18   Height:      Weight: 80.559 kg (177 lb 9.6 oz)   79.742 kg (175 lb 12.8 oz)  SpO2: 94% 97% 95% 97%    Intake/Output Summary (Last 24 hours) at 06/23/14 1241 Last data filed at 06/23/14 1000  Gross per 24 hour  Intake    480 ml  Output   2675 ml  Net  -2195 ml   Filed Weights   06/21/14 0400 06/22/14 0400 06/23/14 0600  Weight: 80.332 kg (177 lb 1.6 oz) 80.559 kg (177 lb 9.6 oz) 79.742 kg (175 lb 12.8 oz)    Exam:   General:  WM, No acute distress  HEENT:  NCAT, MMM  Cardiovascular:  RRR, nl S1, S2, + gallop, 2+ pulses, warm extremities  Respiratory:  Rales at the bilateral bases to the mid back,  no wheezes, no rhonchi, no increased WOB  Abdomen:   NABS, soft, mildly distended, mild tenderness in the epigastric area without rebound or guarding  MSK:   Normal tone and bulk, no LEE  Neuro:  Grossly intact  Data Reviewed: Basic Metabolic Panel:  Recent Labs Lab 06/20/14 0118 06/20/14 0316 06/21/14 0010 06/22/14 0343 06/23/14 0401  NA 139 140 138 143 141  K 3.1* 3.0* 3.5 3.7 3.6  CL 104 105 106 109 105  CO2 GLUCOSE 104* 104* 128* 94 110*  BUN CREATININE 1.69* 1.59* 1.57* 1.42* 1.48*  CALCIUM 7.9* 8.1* 7.9* 8.3* 8.5  MG  --  1.6 2.1 1.8  --    Liver Function Tests:  Recent Labs Lab 06/20/14 0118  AST 17  ALT 13  ALKPHOS 62  BILITOT 0.6  PROT 5.6*  ALBUMIN 3.0*    Recent Labs Lab 06/23/14 0401  LIPASE 24   No results for input(s): AMMONIA in the last 168 hours. CBC:  Recent Labs Lab 06/20/14 0118 06/20/14 0316 06/21/14 0010 06/22/14 0343  WBC 10.4 9.0 7.5 7.4  NEUTROABS 6.6  --   --   --   HGB 12.1* 12.4* 11.8* 11.4*  HCT 35.8* 36.5* 34.7*  35.5* 34.3*  MCV 96.2 97.3 97.5 97.2  PLT 126* 115* 114* 111*   Cardiac Enzymes:  Recent Labs Lab 06/20/14 0118 06/20/14 0316 06/20/14 1120  TROPONINI 0.27* 0.26* 0.15*   BNP (last 3 results)  Recent Labs  06/20/14 0118  BNP 994.8*    ProBNP (last 3 results) No results for input(s): PROBNP in the last 8760 hours.  CBG:  Recent Labs Lab 06/19/14 2226  GLUCAP 84    Recent Results (from the past 240 hour(s))  MRSA PCR Screening     Status: None   Collection Time: 06/19/14  9:35 PM  Result Value Ref Range Status   MRSA by PCR NEGATIVE NEGATIVE Final    Comment:        The GeneXpert MRSA Assay (FDA approved for NASAL specimens only), is one component of a comprehensive MRSA colonization surveillance program. It is not intended to diagnose MRSA infection nor to guide or monitor treatment for MRSA infections.      Studies: No results  found.  Scheduled Meds: . amLODipine  5 mg Oral Daily  . antiseptic oral rinse  7 mL Mouth Rinse BID  . aspirin EC  81 mg Oral Daily  . carvedilol  3.125 mg Oral BID WC  . docusate sodium  100 mg Oral BID  . ferrous sulfate  325 mg Oral BID WC  . furosemide  40 mg Oral BID  . hydrALAZINE  10 mg Oral 3 times per day  . isosorbide mononitrate  15 mg Oral Daily  . pantoprazole  40 mg Oral Daily  . potassium chloride  40 mEq Oral Daily  .  senna  2 tablet Oral QHS  . simvastatin  20 mg Oral q1800  . sodium chloride  3 mL Intravenous Q12H  . spironolactone  12.5 mg Oral Daily   Continuous Infusions:    Principal Problem:   Acute CHF Active Problems:   Tobacco abuse   Essential hypertension   Hypertensive emergency   Elevated troponin   Leukocytosis   Acute systolic heart failure   Nausea & vomiting   Dizziness   Congestive heart disease   AKI (acute kidney injury)    Time spent: 30 min    [facility]  [facility] Pager [phone]. If 7PM-7AM, please contact night-coverage at [email or web address], password [facility] 06/23/2014, 12:41 PM  LOS: 4 days

## 2014-06-23 NOTE — Progress Notes (Signed)
*  PRELIMINARY RESULTS* Vascular Ultrasound Carotid Duplex (Doppler) has been completed.  Preliminary findings: Bilateral:  1-39% ICA stenosis.  Vertebral artery flow is antegrade.       Farrel Demark, RDMS, RVT  06/23/2014, 2:52 PM

## 2014-06-23 NOTE — Progress Notes (Signed)
Patient Profile: 64 year old male with history of tobacco abuse, CKD stage III and hypertension admitted for acute HF. Also with elevated troponin. LHC demonstrated normal coronaries---> nonischemic DCM.  Subjective: Breathing better. Ambulating ok.   Objective: Vital signs in last 24 hours: Temp:  [97.7 F (36.5 C)-98.9 F (37.2 C)] 97.7 F (36.5 C) (03/02 0600) Pulse Rate:  [66-80] 66 (03/02 0600) Resp:  [18-21] 18 (03/02 0600) BP: (126-140)/(81-89) 128/81 mmHg (03/02 0600) SpO2:  [95 %-97 %] 97 % (03/02 0600) Weight:  [175 lb 12.8 oz (79.742 kg)] 175 lb 12.8 oz (79.742 kg) (03/02 0600) Last BM Date: 06/21/14  Intake/Output from previous day: 03/01 0701 - 03/02 0700 In: 480 [P.O.:480] Out: 2625 [Urine:2625] Intake/Output this shift: Total I/O In: 240 [P.O.:240] Out: 250 [Urine:250]  Medications Current Facility-Administered Medications  Medication Dose Route Frequency Provider Last Rate Last Dose  . 0.9 %  sodium chloride infusion  250 mL Intravenous PRN Lorretta Harp, MD 10 mL/hr at 06/20/14 0745 250 mL at 06/20/14 0745  . acetaminophen (TYLENOL) tablet 650 mg  650 mg Oral Q4H PRN Lorretta Harp, MD      . alum & mag hydroxide-simeth (MAALOX/MYLANTA) 200-200-20 MG/5ML suspension 30 mL  30 mL Oral PRN Kathleene Hazel, MD   30 mL at 06/22/14 2145  . amLODipine (NORVASC) tablet 5 mg  5 mg Oral Daily Lorretta Harp, MD   5 mg at 06/22/14 0846  . antiseptic oral rinse (CPC / CETYLPYRIDINIUM CHLORIDE 0.05%) solution 7 mL  7 mL Mouth Rinse BID Lorretta Harp, MD   7 mL at 06/22/14 0846  . aspirin EC tablet 81 mg  81 mg Oral Daily Lorretta Harp, MD   81 mg at 06/22/14 0843  . carvedilol (COREG) tablet 3.125 mg  3.125 mg Oral BID WC Renae Fickle, MD   3.125 mg at 06/23/14 0736  . docusate sodium (COLACE) capsule 100 mg  100 mg Oral BID Renae Fickle, MD   100 mg at 06/22/14 1700  . ferrous sulfate tablet 325 mg  325 mg Oral BID WC Renae Fickle, MD   325 mg at 06/23/14 0736  . furosemide  (LASIX) injection 40 mg  40 mg Intravenous Q12H Kathleene Hazel, MD   40 mg at 06/23/14 0736  . hydrALAZINE (APRESOLINE) injection 5 mg  5 mg Intravenous Q2H PRN Lorretta Harp, MD      . morphine 2 MG/ML injection 2 mg  2 mg Intravenous Q3H PRN Lorretta Harp, MD      . ondansetron [facility]) injection 4 mg  4 mg Intravenous Q6H PRN Lorretta Harp, MD   4 mg at 06/23/14 0736  . ondansetron (ZOFRAN) tablet 4 mg  4 mg Oral Q6H PRN Renae Fickle, MD      . pantoprazole (PROTONIX) EC tablet 40 mg  40 mg Oral Daily Renae Fickle, MD   40 mg at 06/22/14 1708  . potassium chloride SA (K-DUR,KLOR-CON) CR tablet 40 mEq  40 mEq Oral Daily Renae Fickle, MD   40 mEq at 06/22/14 0844  . senna (SENOKOT) tablet 17.2 mg  2 tablet Oral QHS Renae Fickle, MD   17.2 mg at 06/22/14 2134  . simvastatin (ZOCOR) tablet 20 mg  20 mg Oral q1800 Renae Fickle, MD   20 mg at 06/22/14 1711  . sodium chloride 0.9 % injection 3 mL  3 mL Intravenous Q12H Lorretta Harp, MD   3 mL at 06/22/14 2135  . sodium chloride 0.9 % injection 3 mL  3 mL  Intravenous PRN Lorretta Harp, MD      . spironolactone (ALDACTONE) tablet 12.5 mg  12.5 mg Oral Daily Quintella Reichert, MD   12.5 mg at 06/22/14 1136    Physical Exam  General appearance: alert, cooperative and no distress Neck: no carotid bruit and no JVD Lungs: faint rales at the Lt lung base. Right lung fields CTA Heart: regular rate and rhythm, S1, S2 normal, no murmur, click, rub or gallop Extremities: no LEE Pulses: 2+ and symmetric Skin: warm and dry Neurologic: Grossly normal   Lab Results:   Recent Labs  06/21/14 0010 06/22/14 0343  WBC 7.5 7.4  HGB 11.8* 11.4*  HCT 34.7*  35.5* 34.3*  PLT 114* 111*   BMET  Recent Labs  06/21/14 0010 06/22/14 0343 06/23/14 0401  NA 138 143 141  K 3.5 3.7 3.6  CL 106 109 105  CO2 GLUCOSE 128* 94 110*  BUN CREATININE 1.57* 1.42* 1.48*  CALCIUM 7.9* 8.3* 8.5   PT/INR  Recent Labs  06/21/14 0010    LABPROT 14.9  INR 1.15     Assessment/Plan  Principal Problem:   Acute CHF Active Problems:   Tobacco abuse   Essential hypertension   Hypertensive emergency   Elevated troponin   Leukocytosis   Acute systolic heart failure   Nausea & vomiting   Dizziness   Congestive heart disease   AKI (acute kidney injury)   1. Acute Systolic CHF: Improved. No LEE. No resting dyspnea. Still with faint rales in left lower lung fields but significant improvement from yesterday.Convert to PO Lasix today. Continue BB.   2. Non-ischemic Cardiomyopathy: EF 35-40% on echo. LHC w/o angiographic evidence of CAD. No arrhthymias on telemetry. Continue medical therapy. Resume BB therapy with Coreg. Scr still hanging around 1.4. Will defer to MD initiation of an ACE/ARB vs hydralazine + nitrate. Continue spironolactone. We discussed importance of daily weights, low sodium diet and medication adherence. Will repeat 2D echo in 3 months to reassess systolic function.   3. Elevated troponins: minimally elevated w/ flat trend in the setting of CHF. No CAD on cath.  4. HTN: BP controlled. Continue Coreg and amlodipine. Will need to start ACE/ARB vs hydralazine + nitrate for HF.  4. Renal Insufficiency: slight bump in Scr from 1.42--> 1.48 today. SCr was 1.6 on admit. Currently on Lasix and Spironolactone.    5. Tobacco abuse: smoking cessation strongly advised. Pt denies any cravings. Nicotine patch available if needed.   6. HLD: LDL elevated at 123. Continue high dose statin therapy with Lipitor, 80 mg. Recheck fasting lipid and LFTs in 6 weeks.     LOS: 4 days    Brittainy M. Sharol Harness, PA-C 06/23/2014 8:30 AM

## 2014-06-24 DIAGNOSIS — N183 Chronic kidney disease, stage 3 unspecified: Secondary | ICD-10-CM

## 2014-06-24 DIAGNOSIS — N179 Acute kidney failure, unspecified: Secondary | ICD-10-CM

## 2014-06-24 LAB — BASIC METABOLIC PANEL
Anion gap: 7 (ref 5–15)
BUN: 21 mg/dL (ref 6–23)
CO2: 28 mmol/L (ref 19–32)
CREATININE: 1.65 mg/dL — AB (ref 0.50–1.35)
Calcium: 8.3 mg/dL — ABNORMAL LOW (ref 8.4–10.5)
Chloride: 104 mmol/L (ref 96–112)
GFR calc non Af Amer: 43 mL/min — ABNORMAL LOW (ref >90)
GFR, EST AFRICAN AMERICAN: 49 mL/min — AB (ref >90)
Glucose, Bld: 105 mg/dL — ABNORMAL HIGH (ref 70–99)
POTASSIUM: 3.6 mmol/L (ref 3.5–5.1)
Sodium: 139 mmol/L (ref 135–145)

## 2014-06-24 MED ORDER — HYDRALAZINE HCL 25 MG PO TABS: 25.0000 mg | ORAL_TABLET | Freq: Three times a day (TID) | ORAL | Status: DC

## 2014-06-24 MED ORDER — CARVEDILOL 3.125 MG PO TABS: 3.1250 mg | ORAL_TABLET | Freq: Two times a day (BID) | ORAL | Status: DC

## 2014-06-24 MED ORDER — FUROSEMIDE 40 MG PO TABS: 40.0000 mg | ORAL_TABLET | Freq: Every day | ORAL | Status: DC

## 2014-06-24 MED ORDER — ASPIRIN 81 MG PO TBEC: 81.0000 mg | DELAYED_RELEASE_TABLET | Freq: Every day | ORAL | Status: DC

## 2014-06-24 MED ORDER — ISOSORBIDE MONONITRATE ER 30 MG PO TB24: 15.0000 mg | ORAL_TABLET | Freq: Every day | ORAL | Status: DC

## 2014-06-24 MED ORDER — SPIRONOLACTONE 25 MG PO TABS: 12.5000 mg | ORAL_TABLET | Freq: Every day | ORAL | Status: DC

## 2014-06-24 MED ORDER — SIMVASTATIN 20 MG PO TABS: 20.0000 mg | ORAL_TABLET | Freq: Every day | ORAL | Status: DC

## 2014-06-24 NOTE — Progress Notes (Signed)
Patient Name: Wesley Riley Date of Encounter: 06/24/2014    Principal Problem:   Acute systolic heart failure Active Problems:   Hypertensive emergency   Tobacco abuse   Essential hypertension   Elevated troponin   Nausea & vomiting   AKI (acute kidney injury)   Acute renal failure superimposed on stage 3 chronic kidney disease   Leukocytosis   Dizziness   Dyspnea    SUBJECTIVE  Breathing much better.  Eager to go home.  CURRENT MEDS . amLODipine  5 mg Oral Daily  . antiseptic oral rinse  7 mL Mouth Rinse BID  . aspirin EC  81 mg Oral Daily  . carvedilol  3.125 mg Oral BID WC  . docusate sodium  100 mg Oral BID  . ferrous sulfate  325 mg Oral BID WC  . furosemide  40 mg Oral BID  . hydrALAZINE  10 mg Oral 3 times per day  . isosorbide mononitrate  15 mg Oral Daily  . pantoprazole  40 mg Oral Daily  . potassium chloride  40 mEq Oral Daily  . senna  2 tablet Oral QHS  . simvastatin  20 mg Oral q1800  . sodium chloride  3 mL Intravenous Q12H  . spironolactone  12.5 mg Oral Daily    OBJECTIVE  Filed Vitals:   06/23/14 1500 06/23/14 2000 06/24/14 0500 06/24/14 0600  BP: 129/85 140/94 130/83 132/87  Pulse: 64 72 68   Temp: 98 F (36.7 C) 97.8 F (36.6 C) 97.8 F (36.6 C)   TempSrc: Oral Oral Oral   Resp: 18     Height:   5' 9.5" (1.765 m)   Weight:   174 lb (78.926 kg)   SpO2: 95% 94% 95%     Intake/Output Summary (Last 24 hours) at 06/24/14 0909 Last data filed at 06/23/14 2215  Gross per 24 hour  Intake    480 ml  Output   1875 ml  Net  -1395 ml   Filed Weights   06/22/14 0400 06/23/14 0600 06/24/14 0500  Weight: 177 lb 9.6 oz (80.559 kg) 175 lb 12.8 oz (79.742 kg) 174 lb (78.926 kg)    PHYSICAL EXAM  General: Pleasant, NAD. Neuro: Alert and oriented X 3. Moves all extremities spontaneously. Psych: Normal affect. HEENT:  Normal  Neck: Supple without bruits or JVD. Lungs:  Resp regular and unlabored, CTA. Heart: RRR no s3, s4, or  murmurs. Abdomen: Soft, non-tender, non-distended, BS + x 4.  Extremities: No clubbing, cyanosis or edema. DP/PT/Radials 2+ and equal bilaterally.  R wrist cath site w/o bleeding/bruit/hematoma.  Accessory Clinical Findings  CBC  Recent Labs  06/22/14 0343  WBC 7.4  HGB 11.4*  HCT 34.3*  MCV 97.2  PLT 111*   Basic Metabolic Panel  Recent Labs  06/22/14 0343 06/23/14 0401 06/24/14 0450  NA 143 141 139  K 3.7 3.6 3.6  CL 109 105 104  CO2 GLUCOSE 94 110* 105*  BUN CREATININE 1.42* 1.48* 1.65*  CALCIUM 8.3* 8.5 8.3*  MG 1.8  --   --     Recent Labs  06/23/14 0401  LIPASE 24   TELE  rsr  Radiology/Studies  US Renal  06/20/2014   CLINICAL DATA:  64 year old male with acute kidney injury  EXAM: RENAL/URINARY TRACT ULTRASOUND COMPLETE  COMPARISON:  None.  FINDINGS: Right Kidney:  Length: 10.2 cm. Echogenicity within normal limits. No mass or hydronephrosis visualized.  Left Kidney:  Length: 10.2 cm. Echogenicity within normal limits. No mass or hydronephrosis visualized. Dromedary hump.  Bladder:  Appears normal for degree of bladder distention. The prostate gland appears enlarged and does indent the bladder base.  Other: Visualized liver parenchyma is echogenic and slightly coarsened.  IMPRESSION: 1. No evidence of hydronephrosis or other acute renal abnormality. 2. Prostatomegaly with indention of the bladder base. 3. Slightly echogenic and coarsened visualized liver parenchyma suggests possible hepatic steatosis.   Electronically Signed   By: Malachy Moan M.D.   On: 06/20/2014 10:23   Dg Chest Port 1 View  06/21/2014   CLINICAL DATA:  Dyspnea.  Pulmonary edema.  EXAM: PORTABLE CHEST - 1 VIEW  COMPARISON:  06/19/2014  FINDINGS: There is marked improvement of the pulmonary edema with slight residual edema at the right lung base. There is minimal residual atelectasis at the left lung base.  Heart size and pulmonary vascularity are normal.  No acute  osseous abnormality.  IMPRESSION: Almost complete resolution of pulmonary edema.   Electronically Signed   By: Francene Boyers M.D.   On: 06/21/2014 07:46   Carotid U/S 3.2.2016 Vascular Ultrasound Carotid Duplex (Doppler) has been completed.  Preliminary findings: Bilateral:  1-39% ICA stenosis.  Vertebral artery flow is antegrade. _____________  2D Echocardiogram 2.28.2016  Study Conclusions  - Left ventricle: The cavity size was normal. Wall thickness was at   the upper limits of normal. Systolic function was moderately   reduced. The estimated ejection fraction was in the range of 35%   to 40%. Diffuse hypokinesis. Global lateral strain: -12.0% - Aortic valve: There was mild regurgitation. - Aorta: Aortic root dimension: 39 mm (ED). - Ascending aorta: The ascending aorta was mildly dilated. - Mitral valve: There was mild regurgitation. - Left atrium: The atrium was mildly dilated. - Right ventricle: The cavity size was mildly dilated. Wall   thickness was normal. - Right atrium: The atrium was moderately dilated. - Pulmonary arteries: Systolic pressure was mildly increased. PA   peak pressure: 37 mm Hg (S). _____________   ASSESSMENT AND PLAN  1.  Acute systolic chf/NICM:  EF 40% by echo with diff HK.  Cath revealed nl cors.  Minus 1.4 L overnight with net negative of 5L since admission and reduction in weight down to 174.  Prev presumed dry wt 185-190.  Lasix changed to PO yesterday. Creat up from 1.48 yesterday to 1.65 this morning.  Will drop lasix to 40 mg daily.  Cont bb, hydral/nitrate, spiro.  Will push hydral/nitrate and d/c norvasc.  No acei/arb 2/2 AKI.  Plan to f/u echo in 3 mos to reassess LV fxn.  I will arrange office f/u for next week w/ bmet @ that time.  2.  Elevated troponin:  In setting of #1.  Relatively nl cors on cath.  Cont med Rx as above.  3.  HTN:  Improved.  D/c amlodipine in favor of additional titration of hydral/nitrate.  4.  Acute on chronic stage  iii kidney dzs: creat up this AM. Reduce lasix.  F/u bmet in [place] next week.  5.  Tob Abuse:  Cessation advised.  6.  HL:  LDL 123.  Cont statin.  Signed, Nicolasa Ducking NP

## 2014-06-24 NOTE — Discharge Summary (Signed)
Physician Discharge Summary  Wesley Riley:[id number] DOB: October 27, 1950 DOA: 06/19/2014  PCP: [name], DO  Admit date: 06/19/2014 Discharge date: 06/24/2014  Time spent: 40 minutes  Recommendations for Outpatient Follow-up:  1. Follow up with [facility] at [facility] office next week. 2. BMP next week as patient started on Lasix, Aldactone and has creatinine of 1.65 on discharge  Discharge Diagnoses:  Principal Problem:   Acute systolic heart failure Active Problems:   Tobacco abuse   Essential hypertension   Hypertensive emergency   Elevated troponin   Leukocytosis   Nausea & vomiting   Dizziness   AKI (acute kidney injury)   Dyspnea   Acute renal failure superimposed on stage 3 chronic kidney disease   Discharge Condition: Stable  Diet recommendation: Heart healthy diet  Filed Weights   06/22/14 0400 06/23/14 0600 06/24/14 0500  Weight: 80.559 kg (177 lb 9.6 oz) 79.742 kg (175 lb 12.8 oz) 78.926 kg (174 lb)    History of present illness:  Wesley Riley is a 64 y.o. male with past medical history tobacco abuse, hypertension, medication noncompliance, who presents with shortness of breath.  Patient reports that he has hypertension and has not been taking his medications for more than a year. 4 weeks ago, he had one episode of shortness of breath. He was evaluated by his PCP, who thought his SOB was likely due to uncontrolled HTN. He was given prescription of benazepril and hydrochlorothiazide. He took the medications for a few days, and then stopped taking these medications because of headache. At about 3 AM, he started having shortness stress, which has been progressively getting worse. He had mild cough with clear mucus production. He also had chest pressure. He did not have fever or chills. He was evaluated in the emergency room of [facility] hospital. He was found to have pulmonary edema, elevated BNP and troponin 0.53. He was treated as acute congestive heart  failure with Lasix and aspirin. He urinated well and feels better. He is transferred to [facility] hospital for further evaluation and treatment.  Patient denies fever, chills, abdominal pain, diarrhea, constipation, dysuria, urgency, frequency, hematuria, skin rashes, joint pain or leg swelling. No unilateral weakness, numbness or tingling sensations. No vision change or hearing loss.  In ED of [facility] hospital, patient was found to have elevated Bp at 162/114, elevated BNP 1127, elevated troponin 0.18--> 0.53, negative UDS, negative urinalysis for UTI, but positive for protein and hemoglobin, creatinine 1.33, temperature normal. Chest x-ray showed pulmonary edema. EKG showed T-wave inversion in V4 to V6. Patient is admitted to inpatient for further evaluation and treatment.  Hospital Course:   Acute systolic congestive heart failure due to NICM possibly due to uncontrolled HTN  -Presented with cough, clear mucus on top of uncontrolled hypertension, CXR showed pulmonary edema. -2-D echocardiogram showed ejection fraction of 35-40%, cardiac cath showed NICM  -Continue ASA, statin, BB -Started on spironolactone  -TSH and A1c within normal limits. -Troponin peak at 0.53, cardiac cath was done and showed no evidence of CAD. -Did much better, medications adjusted. -Discharged on Coreg, hydralazine, Imdur, Lasix and Aldactone. No ACEI or ARB because of high creatinine.   Nausea may be related to heart failure exacerbation vs. GERD -Lipase and LFTs within normal limits. -Unclear causes, patient describes chronic nausea.   Dizziness -Carotid duplex is negative. This is improved this is also chronic per patient.  HTN, mildly elevated - BB, norvasc  NSVT -9 beats of nonsustained VT, started on Coreg, continued. -  Keep K and Mag wnl  Acute vs. Chronic kidney disease, probably CKD but creatinine gradually improving.  -Unknown creatinine baseline, presented to the hospital with creatinine of  1.69. -On discharge creatinine is 1.65 -Follow renal function closely as patient started on Lasix and Aldactone.   Leukocytosis: Likely due to stress, resolved. No evidence of underlying infection  Tobacco abuse: Smokes 1 pack day for more than 40 years. -Nicotine patch declined  -Counseled pt about importance of quitting smoking.  Normocytic anemia and thrombocytopenia, blood counts stable - Iron studies suggest iron deficiency -  Asked to take OTC iron supplements. - HIV NR, TSH wnl - Occult stool neg - Mild anemia with hemoglobin of 11.5 for the time of discharge, follow closely as outpatient, consider referral to GI if hemoglobin drops.   Procedures:  Cardiac cath done on 06/21/14 with Dr. Sanjuana Kava showed no angiographic evidence of CAD  2-D echocardiogram done 06/20/14 showed: Study Conclusions  - Left ventricle: The cavity size was normal. Wall thickness was at the upper limits of normal. Systolic function was moderately reduced. The estimated ejection fraction was in the range of 35% to 40%. Diffuse hypokinesis. Global lateral strain: -12.0% - Aortic valve: There was mild regurgitation. - Aorta: Aortic root dimension: 39 mm (ED). - Ascending aorta: The ascending aorta was mildly dilated. - Mitral valve: There was mild regurgitation. - Left atrium: The atrium was mildly dilated. - Right ventricle: The cavity size was mildly dilated. Wall thickness was normal. - Right atrium: The atrium was moderately dilated. - Pulmonary arteries: Systolic pressure was mildly increased. PA peak pressure: 37 mm Hg (S).  Consultations:  Cardiology  Discharge Exam: Filed Vitals:   06/24/14 0600  BP: 132/87  Pulse:   Temp:   Resp:    General: Alert and awake, oriented x3, not in any acute distress. HEENT: anicteric sclera, pupils reactive to light and accommodation, EOMI CVS: S1-S2 clear, no murmur rubs or gallops Chest: clear to auscultation bilaterally, no  wheezing, rales or rhonchi Abdomen: soft nontender, nondistended, normal bowel sounds, no organomegaly Extremities: no cyanosis, clubbing or edema noted bilaterally Neuro: Cranial nerves II-XII intact, no focal neurological deficits   Discharge Instructions   Discharge Instructions    Diet - low sodium heart healthy    Complete by:  As directed      Increase activity slowly    Complete by:  As directed           Current Discharge Medication List    START taking these medications   Details  aspirin EC 81 MG EC tablet Take 1 tablet (81 mg total) by mouth daily.    carvedilol (COREG) 3.125 MG tablet Take 1 tablet (3.125 mg total) by mouth 2 (two) times daily with a meal. Qty: 60 tablet, Refills: 0    furosemide (LASIX) 40 MG tablet Take 1 tablet (40 mg total) by mouth daily. Qty: 30 tablet, Refills: 0    hydrALAZINE (APRESOLINE) 25 MG tablet Take 1 tablet (25 mg total) by mouth every 8 (eight) hours. Qty: 90 tablet, Refills: 0    isosorbide mononitrate (IMDUR) 30 MG 24 hr tablet Take 0.5 tablets (15 mg total) by mouth daily. Qty: 30 tablet, Refills: 0    simvastatin (ZOCOR) 20 MG tablet Take 1 tablet (20 mg total) by mouth daily at 6 PM. Qty: 30 tablet, Refills: 0    spironolactone (ALDACTONE) 25 MG tablet Take 0.5 tablets (12.5 mg total) by mouth daily. Qty: 30 tablet, Refills: 0  STOP taking these medications     benazepril-hydrochlorthiazide (LOTENSIN HCT) 10-12.5 MG per tablet        Allergies  Allergen Reactions  . Penicillins Hives and Rash   Follow-up Information    Follow up with Jacolyn Reedy, PA-C On 06/30/2014.   Specialty:  Cardiology   Why:  11:40 PM   Contact information:   [address] [place] [address] [phone]       Follow up with [facility] On 06/29/2014.   Why:  blood chemistry to follow-up kidney function.       The results of significant diagnostics from this hospitalization (including imaging, microbiology,  ancillary and laboratory) are listed below for reference.    Significant Diagnostic Studies: US Renal  06/20/2014   CLINICAL DATA:  64 year old male with acute kidney injury  EXAM: RENAL/URINARY TRACT ULTRASOUND COMPLETE  COMPARISON:  None.  FINDINGS: Right Kidney:  Length: 10.2 cm. Echogenicity within normal limits. No mass or hydronephrosis visualized.  Left Kidney:  Length: 10.2 cm. Echogenicity within normal limits. No mass or hydronephrosis visualized. Dromedary hump.  Bladder:  Appears normal for degree of bladder distention. The prostate gland appears enlarged and does indent the bladder base.  Other: Visualized liver parenchyma is echogenic and slightly coarsened.  IMPRESSION: 1. No evidence of hydronephrosis or other acute renal abnormality. 2. Prostatomegaly with indention of the bladder base. 3. Slightly echogenic and coarsened visualized liver parenchyma suggests possible hepatic steatosis.   Electronically Signed   By: Malachy Moan M.D.   On: 06/20/2014 10:23   Dg Chest Port 1 View  06/21/2014   CLINICAL DATA:  Dyspnea.  Pulmonary edema.  EXAM: PORTABLE CHEST - 1 VIEW  COMPARISON:  06/19/2014  FINDINGS: There is marked improvement of the pulmonary edema with slight residual edema at the right lung base. There is minimal residual atelectasis at the left lung base.  Heart size and pulmonary vascularity are normal.  No acute osseous abnormality.  IMPRESSION: Almost complete resolution of pulmonary edema.   Electronically Signed   By: Francene Boyers M.D.   On: 06/21/2014 07:46    Microbiology: Recent Results (from the past 240 hour(s))  MRSA PCR Screening     Status: None   Collection Time: 06/19/14  9:35 PM  Result Value Ref Range Status   MRSA by PCR NEGATIVE NEGATIVE Final    Comment:        The GeneXpert MRSA Assay (FDA approved for NASAL specimens only), is one component of a comprehensive MRSA colonization surveillance program. It is not intended to diagnose MRSA infection  nor to guide or monitor treatment for MRSA infections.      Labs: Basic Metabolic Panel:  Recent Labs Lab 06/20/14 0316 06/21/14 0010 06/22/14 0343 06/23/14 0401 06/24/14 0450  NA 140 138 143 141 139  K 3.0* 3.5 3.7 3.6 3.6  CL 105 106 109 105 104  CO2 27 28 26 28 28   GLUCOSE 104* 128* 94 110* 105*  BUN 20 23 22 19 21   CREATININE 1.59* 1.57* 1.42* 1.48* 1.65*  CALCIUM 8.1* 7.9* 8.3* 8.5 8.3*  MG 1.6 2.1 1.8  --   --    Liver Function Tests:  Recent Labs Lab 06/20/14 0118  AST 17  ALT 13  ALKPHOS 62  BILITOT 0.6  PROT 5.6*  ALBUMIN 3.0*    Recent Labs Lab 06/23/14 0401  LIPASE 24   No results for input(s): AMMONIA in the last 168 hours. CBC:  Recent  Labs Lab 06/20/14 0118 06/20/14 0316 06/21/14 0010 06/22/14 0343  WBC 10.4 9.0 7.5 7.4  NEUTROABS 6.6  --   --   --   HGB 12.1* 12.4* 11.8* 11.4*  HCT 35.8* 36.5* 34.7*  35.5* 34.3*  MCV 96.2 97.3 97.5 97.2  PLT 126* 115* 114* 111*   Cardiac Enzymes:  Recent Labs Lab 06/20/14 0118 06/20/14 0316 06/20/14 1120  TROPONINI 0.27* 0.26* 0.15*   BNP: BNP (last 3 results)  Recent Labs  06/20/14 0118  BNP 994.8*    ProBNP (last 3 results) No results for input(s): PROBNP in the last 8760 hours.  CBG:  Recent Labs Lab 06/19/14 2226  GLUCAP 84       Signed:  Simon Llamas A  [facility] 06/24/2014, 10:15 AM

## 2014-06-29 ENCOUNTER — Other Ambulatory Visit (HOSPITAL_COMMUNITY): Payer: Self-pay | Admitting: Cardiovascular Disease

## 2014-06-30 ENCOUNTER — Ambulatory Visit (INDEPENDENT_AMBULATORY_CARE_PROVIDER_SITE_OTHER): Payer: Self-pay | Admitting: Physician Assistant

## 2014-06-30 ENCOUNTER — Encounter: Payer: Self-pay | Admitting: Physician Assistant

## 2014-06-30 VITALS — BP 140/98 | HR 76 | Ht 69.5 in | Wt 177.4 lb

## 2014-06-30 DIAGNOSIS — Z720 Tobacco use: Secondary | ICD-10-CM

## 2014-06-30 DIAGNOSIS — I428 Other cardiomyopathies: Secondary | ICD-10-CM | POA: Insufficient documentation

## 2014-06-30 DIAGNOSIS — N179 Acute kidney failure, unspecified: Secondary | ICD-10-CM

## 2014-06-30 DIAGNOSIS — I10 Essential (primary) hypertension: Secondary | ICD-10-CM

## 2014-06-30 DIAGNOSIS — I429 Cardiomyopathy, unspecified: Secondary | ICD-10-CM

## 2014-06-30 DIAGNOSIS — I5020 Unspecified systolic (congestive) heart failure: Secondary | ICD-10-CM

## 2014-06-30 DIAGNOSIS — N183 Chronic kidney disease, stage 3 unspecified: Secondary | ICD-10-CM

## 2014-06-30 LAB — BASIC METABOLIC PANEL
BUN / CREAT RATIO: 17 (ref 10–22)
BUN: 23 mg/dL (ref 8–27)
CHLORIDE: 97 mmol/L (ref 97–108)
CO2: 24 mmol/L (ref 18–29)
Calcium: 9 mg/dL (ref 8.6–10.2)
Creatinine, Ser: 1.37 mg/dL — ABNORMAL HIGH (ref 0.76–1.27)
GFR calc Af Amer: 63 mL/min/{1.73_m2} (ref >59)
GFR, EST NON AFRICAN AMERICAN: 55 mL/min/{1.73_m2} — AB (ref >59)
GLUCOSE: 136 mg/dL — AB (ref 65–99)
Potassium: 3.7 mmol/L (ref 3.5–5.2)
Sodium: 138 mmol/L (ref 134–144)

## 2014-06-30 MED ORDER — CARVEDILOL 6.25 MG PO TABS: 6.2500 mg | ORAL_TABLET | Freq: Two times a day (BID) | ORAL | Status: DC

## 2014-06-30 NOTE — Patient Instructions (Signed)
Your physician recommends that you schedule a follow-up appointment in: 1 month   INCREASE Coreg to 6.25 mg twice a day   Please get blood work in 2 weeks   (March 23 rd )   Your physician has requested that you have an echocardiogram for June. Echocardiography is a painless test that uses sound waves to create images of your heart. It provides your doctor with information about the size and shape of your heart and how well your heart's chambers and valves are working. This procedure takes approximately one hour. There are no restrictions for this procedure.    Thank you for choosing [facility] !

## 2014-06-30 NOTE — Assessment & Plan Note (Signed)
Creatinine has improved. It's down to 1.37. Repeat in 2 weeks.

## 2014-06-30 NOTE — Assessment & Plan Note (Signed)
Patient's ejection fraction 35-40% with diffuse hypokinesis. He had normal coronary arteries at cath. Continue diuretics, nitrates and spironolactone. Increase beta blocker.

## 2014-06-30 NOTE — Assessment & Plan Note (Signed)
Patient is trying to cut back on his smoking. He is down to 1 pack every 2-3 days. Smoking cessation discussed.

## 2014-06-30 NOTE — Progress Notes (Signed)
Cardiology Office Note   Date:  06/30/2014   ID:  LAVONTAE CORNIA, DOB Jul 16, 1950, MRN [id number]  PCP:  Octavio Graves, DO  Cardiologist: New  Chief Complaint:heart failure    History of Present Illness: Wesley Riley is a 64 y.o. male who presents for post hospital follow-up. He was admitted with acute systolic failure due to nonischemic cardiomyopathy probably due to uncontrolled hypertension. 2-D echo EF 35-40%. Cardiac catheterization showed no evidence of CAD. The patient also had 9 beats of nonsustained V. tach and was started on Coreg. He had acute on chronic kidney disease and discharge creatinine was 1.65. No ACE inhibitor or ARB was used.  The patient comes in today feeling quite well. He has a list of his blood pressures from home and they're usually about 120-137/60-80. His blood pressure usually does go up in the doctor's office and he had a political meeting prior to arriving here so it was up higher than usual. He had blood work yesterday and his creatinine was 1.37 potassium 3.7. He is watching his salt closely. He refuses to take simvastatin because of the side effects. He has never taken a statin but refuses based on literature. His LDL was 123 other values normal. He'd like to try red yeast.    Past Medical History  Diagnosis Date  . HTN (hypertension)   . Tobacco abuse     Past Surgical History  Procedure Laterality Date  . Vasectomy    . Hydrocele excision    . Left heart catheterization with coronary angiogram N/A 06/21/2014    Procedure: LEFT HEART CATHETERIZATION WITH CORONARY ANGIOGRAM;  Surgeon: Burnell Blanks, MD;  Location: [facility] CATH LAB;  Service: Cardiovascular;  Laterality: N/A;     Current Outpatient Prescriptions  Medication Sig Dispense Refill  . aspirin EC 81 MG EC tablet Take 1 tablet (81 mg total) by mouth daily.    . carvedilol (COREG) 6.25 MG tablet Take 1 tablet (6.25 mg total) by mouth 2 (two) times daily with a meal. 180 tablet 3   . furosemide (LASIX) 40 MG tablet Take 1 tablet (40 mg total) by mouth daily. 30 tablet 0  . hydrALAZINE (APRESOLINE) 25 MG tablet Take 1 tablet (25 mg total) by mouth every 8 (eight) hours. 90 tablet 0  . spironolactone (ALDACTONE) 25 MG tablet Take 0.5 tablets (12.5 mg total) by mouth daily. 30 tablet 0  . isosorbide mononitrate (IMDUR) 30 MG 24 hr tablet Take 0.5 tablets (15 mg total) by mouth daily. (Patient not taking: Reported on 06/30/2014) 30 tablet 0   No current facility-administered medications for this visit.    Allergies:   Penicillins    Social History:  The patient  reports that he has been smoking Cigars and Cigarettes.  He has a 46 pack-year smoking history. He uses smokeless tobacco.   Family History:  The patient's family history includes Heart disease in an other family member; Stroke in his mother.    ROS:  Please see the history of present illness.   Otherwise, review of systems are positive for none.   All other systems are reviewed and negative.    PHYSICAL EXAM: VS:  BP 140/98 mmHg  Pulse 76  Ht 5' 9.5" (1.765 m)  Wt 177 lb 6.4 oz (80.468 kg)  BMI 25.83 kg/m2  SpO2 98% , BMI Body mass index is 25.83 kg/(m^2). GEN: Well nourished, well developed, in no acute distress Neck: no JVD, HJR, carotid bruits, or masses Cardiac: RRR; no  gallop ,murmurs, rubs, thrill or heave,no edema,   Respiratory:  clear to auscultation bilaterally, normal work of breathing GI: soft, nontender, nondistended, + BS MS: no deformity or atrophy Extremities: without cyanosis, clubbing, edema, good distal pulses bilaterally.  Skin: warm and dry, no rash Neuro:  Strength and sensation are intact Psych: euthymic mood, full affect   EKG:  EKG is not ordered today.    Recent Labs: 06/20/2014: ALT 13; B Natriuretic Peptide 994.8*; TSH 2.283 06/22/2014: Hemoglobin 11.4*; Magnesium 1.8; Platelets 111* 06/29/2014: BUN 23; Creatinine 1.37*; Potassium 3.7; Sodium 138    Lipid Panel     Component Value Date/Time   CHOL 176 06/20/2014 0316   TRIG 58 06/20/2014 0316   HDL 41 06/20/2014 0316   CHOLHDL 4.3 06/20/2014 0316   VLDL 12 06/20/2014 0316   LDLCALC 123* 06/20/2014 0316      Wt Readings from Last 3 Encounters:  06/30/14 177 lb 6.4 oz (80.468 kg)  06/24/14 174 lb (78.926 kg)      Other studies Reviewed: Additional studies/ records that were reviewed today include and review of the records demonstrates:  Impression: 1. No angiographic evidence of CAD 2. Non-ischemic cardiomyopathy 3. Elevated filling pressures  Recommendations: Will resume IV Lasix. Titration of CHF meds as renal function tolerates.          Complications:  None. The patient tolerated the procedure well.                         Procedures:  Cardiac cath done on 06/21/14 with Dr. Julianne Handler showed no angiographic evidence of CAD  2-D echocardiogram done 06/20/14 showed: Study Conclusions  - Left ventricle: The cavity size was normal. Wall thickness was at   the upper limits of normal. Systolic function was moderately   reduced. The estimated ejection fraction was in the range of 35%   to 40%. Diffuse hypokinesis. Global lateral strain: -12.0% - Aortic valve: There was mild regurgitation. - Aorta: Aortic root dimension: 39 mm (ED). - Ascending aorta: The ascending aorta was mildly dilated. - Mitral valve: There was mild regurgitation. - Left atrium: The atrium was mildly dilated. - Right ventricle: The cavity size was mildly dilated. Wall   thickness was normal. - Right atrium: The atrium was moderately dilated. - Pulmonary arteries: Systolic pressure was mildly increased. PA   peak pressure: 37 mm Hg (S).    ASSESSMENT AND PLAN: Congestive heart disease Patient's heart failure is compensated. Renal function has stabilized. Continue current medications except increase carvedilol to 6.25 mg twice a day. Repeat be met in 2 weeks.   Essential hypertension Blood pressure is  elevated today. Increase Coreg to 6.25 mg twice a day.   Acute renal failure superimposed on stage 3 chronic kidney disease Creatinine has improved. It's down to 1.37. Repeat in 2 weeks.   Tobacco abuse Patient is trying to cut back on his smoking. He is down to 1 pack every 2-3 days. Smoking cessation discussed.   Nonischemic cardiomyopathy Patient's ejection fraction 35-40% with diffuse hypokinesis. He had normal coronary arteries at cath. Continue diuretics, nitrates and spironolactone. Increase beta blocker.      Sumner Boast, PA-C  06/30/2014 12:24 PM    [facility] [place], [place], [place]  [address] Phone: [phone]; Fax: [phone]

## 2014-06-30 NOTE — Assessment & Plan Note (Signed)
Patient's heart failure is compensated. Renal function has stabilized. Continue current medications except increase carvedilol to 6.25 mg twice a day. Repeat be met in 2 weeks.

## 2014-06-30 NOTE — Assessment & Plan Note (Signed)
Blood pressure is elevated today. Increase Coreg to 6.25 mg twice a day.

## 2014-07-08 ENCOUNTER — Telehealth: Payer: Self-pay | Admitting: *Deleted

## 2014-07-08 MED ORDER — CARVEDILOL 12.5 MG PO TABS: 12.5000 mg | ORAL_TABLET | Freq: Two times a day (BID) | ORAL | Status: DC

## 2014-07-08 NOTE — Telephone Encounter (Signed)
Coreg would not make blood pressure higher, it only lowers blood pressure. I would increase it further to 12.5mg  twice a day and continue to follow blood pressures. Coreg not only helps lower blood pressure but also helps to strengthen the heart so higher doses will help with both. Keep bp log x 1 week and call with results   Dominga Ferry MD

## 2014-07-08 NOTE — Addendum Note (Signed)
Addended by: Marlyn Corporal A on: 07/08/2014 01:33 PM   Modules accepted: Orders, Medications

## 2014-07-08 NOTE — Telephone Encounter (Signed)
Daughter called and states that father's BP has been increasing since increasing Coreg. Tue. 158/108, Wed. 162/106, Thur. 161/114 HR has been 66-70. Please advise.

## 2014-07-08 NOTE — Telephone Encounter (Signed)
Daughter Erskine Squibb, [facility]

## 2014-07-08 NOTE — Telephone Encounter (Signed)
Spoke with pt, he will call back with BP log

## 2014-07-19 ENCOUNTER — Telehealth: Payer: Self-pay | Admitting: Cardiovascular Disease

## 2014-07-20 MED ORDER — HYDRALAZINE HCL 50 MG PO TABS: 50.0000 mg | ORAL_TABLET | Freq: Three times a day (TID) | ORAL | Status: DC

## 2014-07-20 NOTE — Telephone Encounter (Signed)
i spoke with daughter Efraim Kaufmann, she verbalized understanding of dose increase,escribed to pharmacy

## 2014-07-20 NOTE — Telephone Encounter (Signed)
I will forward to Dr [name], appears she is following up with him in April   J Maree Ainley MD

## 2014-07-20 NOTE — Telephone Encounter (Signed)
Would increase hydralazine to 50 mg tid.

## 2014-07-20 NOTE — Telephone Encounter (Signed)
BP on coreg 12.5 mg BID per daughter 157/99    HR 70  159/106  HR 70 160/109  HR 65 160/111  HR 74 158/104  HR 67 163/115  HR 70

## 2014-07-21 ENCOUNTER — Telehealth: Payer: Self-pay | Admitting: Cardiovascular Disease

## 2014-07-21 MED ORDER — SPIRONOLACTONE 25 MG PO TABS: 12.5000 mg | ORAL_TABLET | Freq: Every day | ORAL | Status: DC

## 2014-07-21 MED ORDER — ISOSORBIDE MONONITRATE ER 30 MG PO TB24: 15.0000 mg | ORAL_TABLET | Freq: Every day | ORAL | Status: DC

## 2014-07-21 MED ORDER — FUROSEMIDE 40 MG PO TABS: 40.0000 mg | ORAL_TABLET | Freq: Every day | ORAL | Status: DC

## 2014-07-21 NOTE — Telephone Encounter (Signed)
Patient's spouse called wanting refills on Isosorbide, Spironolactone and Firosemide called to [facility] in [place]

## 2014-08-05 ENCOUNTER — Ambulatory Visit: Payer: Self-pay | Admitting: Cardiovascular Disease

## 2014-08-10 ENCOUNTER — Other Ambulatory Visit: Payer: Self-pay | Admitting: Physician Assistant

## 2014-08-11 LAB — BASIC METABOLIC PANEL
BUN / CREAT RATIO: 15 (ref 10–22)
BUN: 19 mg/dL (ref 8–27)
CHLORIDE: 99 mmol/L (ref 97–108)
CO2: 26 mmol/L (ref 18–29)
Calcium: 9.1 mg/dL (ref 8.6–10.2)
Creatinine, Ser: 1.23 mg/dL (ref 0.76–1.27)
GFR calc non Af Amer: 62 mL/min/{1.73_m2} (ref >59)
GFR, EST AFRICAN AMERICAN: 71 mL/min/{1.73_m2} (ref >59)
Glucose: 101 mg/dL — ABNORMAL HIGH (ref 65–99)
Potassium: 4.4 mmol/L (ref 3.5–5.2)
Sodium: 139 mmol/L (ref 134–144)

## 2014-08-12 ENCOUNTER — Encounter: Payer: Self-pay | Admitting: Adult Health

## 2014-08-12 ENCOUNTER — Ambulatory Visit (INDEPENDENT_AMBULATORY_CARE_PROVIDER_SITE_OTHER): Payer: Self-pay | Admitting: Adult Health

## 2014-08-12 VITALS — BP 134/88 | HR 67 | Ht 69.5 in | Wt 185.0 lb

## 2014-08-12 DIAGNOSIS — I10 Essential (primary) hypertension: Secondary | ICD-10-CM

## 2014-08-12 DIAGNOSIS — I5022 Chronic systolic (congestive) heart failure: Secondary | ICD-10-CM

## 2014-08-12 MED ORDER — SPIRONOLACTONE 25 MG PO TABS: 25.0000 mg | ORAL_TABLET | Freq: Every day | ORAL | Status: DC

## 2014-08-12 MED ORDER — HYDRALAZINE HCL 50 MG PO TABS: 75.0000 mg | ORAL_TABLET | Freq: Three times a day (TID) | ORAL | Status: DC

## 2014-08-12 NOTE — Progress Notes (Deleted)
Name: Wesley Riley    DOB: Sep 16, 1950  Age: 64 y.o.  MR#: [id number]       PCP:  Octavio Graves, DO      Insurance: Payor: / No coverage found.  CC:    Chief Complaint  Patient presents with  . Hypertension    VS Filed Vitals:   08/12/14 1440  BP: 134/88  Pulse: 67  Height: 5' 9.5" (1.765 m)  Weight: 185 lb (83.915 kg)  SpO2: 96%    Weights Current Weight  08/12/14 185 lb (83.915 kg)  06/30/14 177 lb 6.4 oz (80.468 kg)  06/24/14 174 lb (78.926 kg)    Blood Pressure  BP Readings from Last 3 Encounters:  08/12/14 134/88  06/30/14 140/98  06/24/14 132/87     Admit date:  (Not on file) Last encounter with RMR:  Visit date not found   Allergy Penicillins  Current Outpatient Prescriptions  Medication Sig Dispense Refill  . aspirin EC 81 MG EC tablet Take 1 tablet (81 mg total) by mouth daily.    . carvedilol (COREG) 12.5 MG tablet Take 1 tablet (12.5 mg total) by mouth 2 (two) times daily. 180 tablet 3  . furosemide (LASIX) 40 MG tablet Take 1 tablet (40 mg total) by mouth daily. 30 tablet 3  . hydrALAZINE (APRESOLINE) 50 MG tablet Take 1 tablet (50 mg total) by mouth 3 (three) times daily. 270 tablet 3  . isosorbide mononitrate (IMDUR) 30 MG 24 hr tablet Take 0.5 tablets (15 mg total) by mouth daily. 30 tablet 3  . spironolactone (ALDACTONE) 25 MG tablet Take 0.5 tablets (12.5 mg total) by mouth daily. 30 tablet 3   No current facility-administered medications for this visit.    Discontinued Meds:   There are no discontinued medications.  Patient Active Problem List   Diagnosis Date Noted  . Nonischemic cardiomyopathy 06/30/2014  . Acute renal failure superimposed on stage 3 chronic kidney disease 06/24/2014  . Dyspnea   . Nausea & vomiting 06/22/2014  . Dizziness 06/22/2014  . Congestive heart disease   . AKI (acute kidney injury)   . Acute systolic heart failure   . Acute CHF 06/19/2014  . Tobacco abuse 06/19/2014  . Essential hypertension 06/19/2014  .  Hypertensive emergency 06/19/2014  . Elevated troponin 06/19/2014  . Leukocytosis 06/19/2014    LABS    Component Value Date/Time   NA 139 08/10/2014 1308   NA 138 06/29/2014 1103   NA 139 06/24/2014 0450   NA 141 06/23/2014 0401   NA 143 06/22/2014 0343   K 4.4 08/10/2014 1308   K 3.7 06/29/2014 1103   K 3.6 06/24/2014 0450   CL 99 08/10/2014 1308   CL 97 06/29/2014 1103   CL 104 06/24/2014 0450   CO2 26 08/10/2014 1308   CO2 24 06/29/2014 1103   CO2 28 06/24/2014 0450   GLUCOSE 101* 08/10/2014 1308   GLUCOSE 136* 06/29/2014 1103   GLUCOSE 105* 06/24/2014 0450   GLUCOSE 110* 06/23/2014 0401   GLUCOSE 94 06/22/2014 0343   BUN 19 08/10/2014 1308   BUN 23 06/29/2014 1103   BUN 21 06/24/2014 0450   BUN 19 06/23/2014 0401   BUN 22 06/22/2014 0343   CREATININE 1.23 08/10/2014 1308   CREATININE 1.37* 06/29/2014 1103   CREATININE 1.65* 06/24/2014 0450   CALCIUM 9.1 08/10/2014 1308   CALCIUM 9.0 06/29/2014 1103   CALCIUM 8.3* 06/24/2014 0450   GFRNONAA 62 08/10/2014 1308   GFRNONAA 55* 06/29/2014 1103  GFRNONAA 43* 06/24/2014 0450   GFRAA 71 08/10/2014 1308   GFRAA 63 06/29/2014 1103   GFRAA 49* 06/24/2014 0450   CMP     Component Value Date/Time   NA 139 08/10/2014 1308   NA 139 06/24/2014 0450   K 4.4 08/10/2014 1308   CL 99 08/10/2014 1308   CO2 26 08/10/2014 1308   GLUCOSE 101* 08/10/2014 1308   GLUCOSE 105* 06/24/2014 0450   BUN 19 08/10/2014 1308   BUN 21 06/24/2014 0450   CREATININE 1.23 08/10/2014 1308   CALCIUM 9.1 08/10/2014 1308   PROT 5.6* 06/20/2014 0118   ALBUMIN 3.0* 06/20/2014 0118   AST 17 06/20/2014 0118   ALT 13 06/20/2014 0118   ALKPHOS 62 06/20/2014 0118   BILITOT 0.6 06/20/2014 0118   GFRNONAA 62 08/10/2014 1308   GFRAA 71 08/10/2014 1308       Component Value Date/Time   WBC 7.4 06/22/2014 0343   WBC 7.5 06/21/2014 0010   WBC 9.0 06/20/2014 0316   HGB 11.4* 06/22/2014 0343   HGB 11.8* 06/21/2014 0010   HGB 12.4* 06/20/2014  0316   HCT 34.3* 06/22/2014 0343   HCT 35.5* 06/21/2014 0010   HCT 34.7* 06/21/2014 0010   HCT 36.5* 06/20/2014 0316   MCV 97.2 06/22/2014 0343   MCV 97.5 06/21/2014 0010   MCV 97.3 06/20/2014 0316    Lipid Panel     Component Value Date/Time   CHOL 176 06/20/2014 0316   TRIG 58 06/20/2014 0316   HDL 41 06/20/2014 0316   CHOLHDL 4.3 06/20/2014 0316   VLDL 12 06/20/2014 0316   LDLCALC 123* 06/20/2014 0316    ABG No results found for: PHART, PCO2ART, PO2ART, HCO3, TCO2, ACIDBASEDEF, O2SAT   Lab Results  Component Value Date   TSH 2.283 06/20/2014   BNP (last 3 results)  Recent Labs  06/20/14 0118  BNP 994.8*    ProBNP (last 3 results) No results for input(s): PROBNP in the last 8760 hours.  Cardiac Panel (last 3 results) No results for input(s): CKTOTAL, CKMB, TROPONINI, RELINDX in the last 72 hours.  Iron/TIBC/Ferritin/ %Sat    Component Value Date/Time   IRON 24* 06/21/2014 0010   TIBC 201* 06/21/2014 0010   FERRITIN 262 06/21/2014 0010   IRONPCTSAT 12* 06/21/2014 0010     EKG Orders placed or performed during the hospital encounter of 06/19/14  . EKG 12-Lead  . EKG 12-Lead  . EKG 12-Lead  . EKG 12-Lead  . EKG 12-Lead  . EKG 12-Lead  . EKG 12-Lead  . EKG 12-Lead  . EKG 12-Lead  . EKG 12-Lead  . EKG 12-Lead  . EKG 12-Lead     Prior Assessment and Plan Problem List as of 08/12/2014      Cardiovascular and Mediastinum   Acute CHF   Essential hypertension   Last Assessment & Plan 06/30/2014 Office Visit Written 06/30/2014 12:33 PM by Imogene Burn, PA-C    Blood pressure is elevated today. Increase Coreg to 6.25 mg twice a day.      Hypertensive emergency   Acute systolic heart failure   Congestive heart disease   Last Assessment & Plan 06/30/2014 Office Visit Written 06/30/2014 12:33 PM by Imogene Burn, PA-C    Patient's heart failure is compensated. Renal function has stabilized. Continue current medications except increase carvedilol to 6.25  mg twice a day. Repeat be met in 2 weeks.      Nonischemic cardiomyopathy   Last Assessment & Plan  06/30/2014 Office Visit Written 06/30/2014 12:37 PM by Imogene Burn, PA-C    Patient's ejection fraction 35-40% with diffuse hypokinesis. He had normal coronary arteries at cath. Continue diuretics, nitrates and spironolactone. Increase beta blocker.        Digestive   Nausea & vomiting     Genitourinary   AKI (acute kidney injury)   Acute renal failure superimposed on stage 3 chronic kidney disease   Last Assessment & Plan 06/30/2014 Office Visit Written 06/30/2014 12:34 PM by Imogene Burn, PA-C    Creatinine has improved. It's down to 1.37. Repeat in 2 weeks.        Other   Tobacco abuse   Last Assessment & Plan 06/30/2014 Office Visit Written 06/30/2014 12:34 PM by Imogene Burn, PA-C    Patient is trying to cut back on his smoking. He is down to 1 pack every 2-3 days. Smoking cessation discussed.      Elevated troponin   Leukocytosis   Dizziness   Dyspnea       Imaging: No results found.

## 2014-08-12 NOTE — Patient Instructions (Addendum)
Your physician recommends that you schedule a follow-up appointment in: 1 month with Joni Reining, NP  Your physician recommends that you return for lab work in: 1 week   Your physician has recommended you make the following change in your medication:  Increase Spironolactone 25 mg Daily  Increase Hydralazine to 75 mg Three times Daily   Thank you for choosing [facility]!

## 2014-08-12 NOTE — Progress Notes (Signed)
Cardiology Office Note   Date:  08/12/2014   ID:  STATHAM STORMONT, DOB 1951/02/07, MRN [id number]  PCP:  [name], DO  Cardiologist:  To be established with Branch/ Joni Reining, NP   Chief Complaint  Patient presents with  . Hypertension      History of Present Illness: Wesley Riley is a 64 y.o. male who presents for assessment and management of hypertensive heart disease,recent admission to the hospital with chest pain, revealing an EF of 35-40% per cardiac catheterization without evidence of CAD.  He was last in the office on 06/30/2014.  At the time of the office visit.  He was well compensated, his blood pressure was moderately elevated, carvedilol was increased to 6.25 mg twice a day.  He is advised on smoking cessation.  He is here for followup.  He comes with careful documentation of his BP, HR , Wt and medications. Review of copies of this document demonstrates avg BP 157/99 with HR 88-90. He states that he has gained some wt since being seen last. 8 lbs. He denies eating salty foods or adding salt to his diet. He denies LEE, dyspnea or chest pain. He is concerned about BP rising. He continues to smoke.    Past Medical History  Diagnosis Date  . HTN (hypertension)   . Tobacco abuse     Past Surgical History  Procedure Laterality Date  . Vasectomy    . Hydrocele excision    . Left heart catheterization with coronary angiogram N/A 06/21/2014    Procedure: LEFT HEART CATHETERIZATION WITH CORONARY ANGIOGRAM;  Surgeon: Kathleene Hazel, MD;  Location: [facility] CATH LAB;  Service: Cardiovascular;  Laterality: N/A;     Current Outpatient Prescriptions  Medication Sig Dispense Refill  . aspirin EC 81 MG EC tablet Take 1 tablet (81 mg total) by mouth daily.    . carvedilol (COREG) 12.5 MG tablet Take 1 tablet (12.5 mg total) by mouth 2 (two) times daily. 180 tablet 3  . furosemide (LASIX) 40 MG tablet Take 1 tablet (40 mg total) by mouth daily. 30 tablet 3  .  hydrALAZINE (APRESOLINE) 50 MG tablet Take 1 tablet (50 mg total) by mouth 3 (three) times daily. 270 tablet 3  . isosorbide mononitrate (IMDUR) 30 MG 24 hr tablet Take 0.5 tablets (15 mg total) by mouth daily. 30 tablet 3  . spironolactone (ALDACTONE) 25 MG tablet Take 0.5 tablets (12.5 mg total) by mouth daily. 30 tablet 3   No current facility-administered medications for this visit.    Allergies:   Penicillins    Social History:  The patient  reports that he has been smoking Cigars and Cigarettes.  He has a 46 pack-year smoking history. He uses smokeless tobacco.   Family History:  The patient's family history includes Heart disease in an other family member; Stroke in his mother.    ROS: .   All other systems are reviewed and negative.Unless otherwise mentioned in H&P above.   PHYSICAL EXAM: VS:  BP 134/88 mmHg  Pulse 67  Ht 5' 9.5" (1.765 m)  Wt 185 lb (83.915 kg)  BMI 26.94 kg/m2  SpO2 96% , BMI Body mass index is 26.94 kg/(m^2). GEN: Well nourished, well developed, in no acute distress HEENT: normal Neck: no JVD, carotid bruits, or masses Cardiac:RRR; no murmurs, rubs, or gallops,no edema  Respiratory:  clear to auscultation bilaterally, normal work of breathing GI: soft, nontender, nondistended, + BS MS: no deformity or atrophy Skin: warm and  dry, no rash Neuro:  Strength and sensation are intact Psych: euthymic mood, full affect  Recent Labs: 06/20/2014: ALT 13; B Natriuretic Peptide 994.8*; TSH 2.283 06/22/2014: Hemoglobin 11.4*; Magnesium 1.8; Platelets 111* 08/10/2014: BUN 19; Creatinine 1.23; Potassium 4.4; Sodium 139    Lipid Panel    Component Value Date/Time   CHOL 176 06/20/2014 0316   TRIG 58 06/20/2014 0316   HDL 41 06/20/2014 0316   CHOLHDL 4.3 06/20/2014 0316   VLDL 12 06/20/2014 0316   LDLCALC 123* 06/20/2014 0316      Wt Readings from Last 3 Encounters:  08/12/14 185 lb (83.915 kg)  06/30/14 177 lb 6.4 oz (80.468 kg)  06/24/14 174 lb  (78.926 kg)      Other studies Reviewed: Additional studies/ records that were reviewed today include: Recent echo. . Review of the above records demonstrates: LV 35%-40%   ASSESSMENT AND PLAN:  1.  Hypertension: BP is not well controlled for patient with systolic dysfunction. I will be more aggressive on his medications. Will increase Spironolactone to 25 mg daily, and increasehyralazine to 75 mg TID. He will have follow up labs BMET in one week. Will see him in one month. He is to continue to record BP as directed.   2. Systolic Dysfunction: Continue to monitor BP and labs. Will not go up on Coreg at this time. May consider doing so once we see him on follow up.   3. Ongoing tobacco abuse: Smoking cessation discussed. He is cutting down.    Current medicines are reviewed at length with the patient today.    Labs/ tests ordered today include: BMET No orders of the defined types were placed in this encounter.     Disposition:   FU with  One month Signed, Joni Reining, NP  08/12/2014 2:50 PM    [facility] [address] [place], [place] [address] Phone: [phone]; Fax: [phone]

## 2014-08-20 LAB — BASIC METABOLIC PANEL
BUN: 26 mg/dL — AB (ref 6–23)
CALCIUM: 8.7 mg/dL (ref 8.4–10.5)
CO2: 26 mEq/L (ref 19–32)
CREATININE: 1.29 mg/dL (ref 0.50–1.35)
Chloride: 105 mEq/L (ref 96–112)
GLUCOSE: 109 mg/dL — AB (ref 70–99)
POTASSIUM: 3.9 meq/L (ref 3.5–5.3)
SODIUM: 140 meq/L (ref 135–145)

## 2014-09-09 ENCOUNTER — Encounter: Payer: Self-pay | Admitting: Adult Health

## 2014-09-09 ENCOUNTER — Ambulatory Visit (INDEPENDENT_AMBULATORY_CARE_PROVIDER_SITE_OTHER): Payer: Self-pay | Admitting: Adult Health

## 2014-09-09 VITALS — BP 139/96 | HR 75 | Ht 69.5 in | Wt 187.4 lb

## 2014-09-09 DIAGNOSIS — I10 Essential (primary) hypertension: Secondary | ICD-10-CM

## 2014-09-09 MED ORDER — ALPRAZOLAM 0.25 MG PO TABS: 0.2500 mg | ORAL_TABLET | Freq: Every evening | ORAL | Status: DC | PRN

## 2014-09-09 NOTE — Progress Notes (Deleted)
Name: Wesley Riley    DOB: [birth year]  Age: 64 y.o.  MR#: [id number]       PCP:  Octavio Graves, DO      Insurance: Payor: / No coverage found.  CC:    Chief Complaint  Patient presents with  . Cardiomyopathy  . Hypertension    VS Filed Vitals:   09/09/14 1349  BP: 139/96  Pulse: 75  Height: 5' 9.5" (1.765 m)  Weight: 187 lb 6.4 oz (85.004 kg)  SpO2: 97%    Weights Current Weight  09/09/14 187 lb 6.4 oz (85.004 kg)  08/12/14 185 lb (83.915 kg)  06/30/14 177 lb 6.4 oz (80.468 kg)    Blood Pressure  BP Readings from Last 3 Encounters:  09/09/14 139/96  08/12/14 134/88  06/30/14 140/98     Admit date:  (Not on file) Last encounter with RMR:  08/12/2014   Allergy Penicillins  Current Outpatient Prescriptions  Medication Sig Dispense Refill  . aspirin EC 81 MG EC tablet Take 1 tablet (81 mg total) by mouth daily.    . carvedilol (COREG) 12.5 MG tablet Take 1 tablet (12.5 mg total) by mouth 2 (two) times daily. 180 tablet 3  . furosemide (LASIX) 40 MG tablet Take 1 tablet (40 mg total) by mouth daily. 30 tablet 3  . hydrALAZINE (APRESOLINE) 50 MG tablet Take 1.5 tablets (75 mg total) by mouth 3 (three) times daily. 135 tablet 11  . isosorbide mononitrate (IMDUR) 30 MG 24 hr tablet Take 0.5 tablets (15 mg total) by mouth daily. 30 tablet 3  . spironolactone (ALDACTONE) 25 MG tablet Take 1 tablet (25 mg total) by mouth daily. 90 tablet 3   No current facility-administered medications for this visit.    Discontinued Meds:   There are no discontinued medications.  Patient Active Problem List   Diagnosis Date Noted  . Nonischemic cardiomyopathy 06/30/2014  . Acute renal failure superimposed on stage 3 chronic kidney disease 06/24/2014  . Dyspnea   . Nausea & vomiting 06/22/2014  . Dizziness 06/22/2014  . Congestive heart disease   . AKI (acute kidney injury)   . Acute systolic heart failure   . Acute CHF 06/19/2014  . Tobacco abuse 06/19/2014  . Essential  hypertension 06/19/2014  . Hypertensive emergency 06/19/2014  . Elevated troponin 06/19/2014  . Leukocytosis 06/19/2014    LABS    Component Value Date/Time   NA 140 08/19/2014 1313   NA 139 08/10/2014 1308   NA 138 06/29/2014 1103   NA 139 06/24/2014 0450   NA 141 06/23/2014 0401   K 3.9 08/19/2014 1313   K 4.4 08/10/2014 1308   K 3.7 06/29/2014 1103   CL 105 08/19/2014 1313   CL 99 08/10/2014 1308   CL 97 06/29/2014 1103   CO2 26 08/19/2014 1313   CO2 26 08/10/2014 1308   CO2 24 06/29/2014 1103   GLUCOSE 109* 08/19/2014 1313   GLUCOSE 101* 08/10/2014 1308   GLUCOSE 136* 06/29/2014 1103   GLUCOSE 105* 06/24/2014 0450   GLUCOSE 110* 06/23/2014 0401   BUN 26* 08/19/2014 1313   BUN 19 08/10/2014 1308   BUN 23 06/29/2014 1103   BUN 21 06/24/2014 0450   BUN 19 06/23/2014 0401   CREATININE 1.29 08/19/2014 1313   CREATININE 1.23 08/10/2014 1308   CREATININE 1.37* 06/29/2014 1103   CREATININE 1.65* 06/24/2014 0450   CALCIUM 8.7 08/19/2014 1313   CALCIUM 9.1 08/10/2014 1308   CALCIUM 9.0 06/29/2014 1103  GFRNONAA 62 08/10/2014 1308   GFRNONAA 55* 06/29/2014 1103   GFRNONAA 43* 06/24/2014 0450   GFRAA 71 08/10/2014 1308   GFRAA 63 06/29/2014 1103   GFRAA 49* 06/24/2014 0450   CMP     Component Value Date/Time   NA 140 08/19/2014 1313   NA 139 08/10/2014 1308   K 3.9 08/19/2014 1313   CL 105 08/19/2014 1313   CO2 26 08/19/2014 1313   GLUCOSE 109* 08/19/2014 1313   GLUCOSE 101* 08/10/2014 1308   BUN 26* 08/19/2014 1313   BUN 19 08/10/2014 1308   CREATININE 1.29 08/19/2014 1313   CREATININE 1.23 08/10/2014 1308   CALCIUM 8.7 08/19/2014 1313   PROT 5.6* 06/20/2014 0118   ALBUMIN 3.0* 06/20/2014 0118   AST 17 06/20/2014 0118   ALT 13 06/20/2014 0118   ALKPHOS 62 06/20/2014 0118   BILITOT 0.6 06/20/2014 0118   GFRNONAA 62 08/10/2014 1308   GFRAA 71 08/10/2014 1308       Component Value Date/Time   WBC 7.4 06/22/2014 0343   WBC 7.5 06/21/2014 0010   WBC  9.0 06/20/2014 0316   HGB 11.4* 06/22/2014 0343   HGB 11.8* 06/21/2014 0010   HGB 12.4* 06/20/2014 0316   HCT 34.3* 06/22/2014 0343   HCT 35.5* 06/21/2014 0010   HCT 34.7* 06/21/2014 0010   HCT 36.5* 06/20/2014 0316   MCV 97.2 06/22/2014 0343   MCV 97.5 06/21/2014 0010   MCV 97.3 06/20/2014 0316    Lipid Panel     Component Value Date/Time   CHOL 176 06/20/2014 0316   TRIG 58 06/20/2014 0316   HDL 41 06/20/2014 0316   CHOLHDL 4.3 06/20/2014 0316   VLDL 12 06/20/2014 0316   LDLCALC 123* 06/20/2014 0316    ABG No results found for: PHART, PCO2ART, PO2ART, HCO3, TCO2, ACIDBASEDEF, O2SAT   Lab Results  Component Value Date   TSH 2.283 06/20/2014   BNP (last 3 results)  Recent Labs  06/20/14 0118  BNP 994.8*    ProBNP (last 3 results) No results for input(s): PROBNP in the last 8760 hours.  Cardiac Panel (last 3 results) No results for input(s): CKTOTAL, CKMB, TROPONINI, RELINDX in the last 72 hours.  Iron/TIBC/Ferritin/ %Sat    Component Value Date/Time   IRON 24* 06/21/2014 0010   TIBC 201* 06/21/2014 0010   FERRITIN 262 06/21/2014 0010   IRONPCTSAT 12* 06/21/2014 0010     EKG Orders placed or performed during the hospital encounter of 06/19/14  . EKG 12-Lead  . EKG 12-Lead  . EKG 12-Lead  . EKG 12-Lead  . EKG 12-Lead  . EKG 12-Lead  . EKG 12-Lead  . EKG 12-Lead  . EKG 12-Lead  . EKG 12-Lead  . EKG 12-Lead  . EKG 12-Lead     Prior Assessment and Plan Problem List as of 09/09/2014      Cardiovascular and Mediastinum   Acute CHF   Essential hypertension   Last Assessment & Plan 06/30/2014 Office Visit Written 06/30/2014 12:33 PM by Imogene Burn, PA-C    Blood pressure is elevated today. Increase Coreg to 6.25 mg twice a day.      Hypertensive emergency   Acute systolic heart failure   Congestive heart disease   Last Assessment & Plan 06/30/2014 Office Visit Written 06/30/2014 12:33 PM by Imogene Burn, PA-C    Patient's heart failure is  compensated. Renal function has stabilized. Continue current medications except increase carvedilol to 6.25 mg twice a day. Repeat  be met in 2 weeks.      Nonischemic cardiomyopathy   Last Assessment & Plan 06/30/2014 Office Visit Written 06/30/2014 12:37 PM by Imogene Burn, PA-C    Patient's ejection fraction 35-40% with diffuse hypokinesis. He had normal coronary arteries at cath. Continue diuretics, nitrates and spironolactone. Increase beta blocker.        Digestive   Nausea & vomiting     Genitourinary   AKI (acute kidney injury)   Acute renal failure superimposed on stage 3 chronic kidney disease   Last Assessment & Plan 06/30/2014 Office Visit Written 06/30/2014 12:34 PM by Imogene Burn, PA-C    Creatinine has improved. It's down to 1.37. Repeat in 2 weeks.        Other   Tobacco abuse   Last Assessment & Plan 06/30/2014 Office Visit Written 06/30/2014 12:34 PM by Imogene Burn, PA-C    Patient is trying to cut back on his smoking. He is down to 1 pack every 2-3 days. Smoking cessation discussed.      Elevated troponin   Leukocytosis   Dizziness   Dyspnea       Imaging: No results found.

## 2014-09-09 NOTE — Patient Instructions (Addendum)
Your physician recommends that you schedule a follow-up appointment in: 3 months with Joni Reining, NP.   Please take Imdur at night with Tylenol   You have been given Xanax 0.25 with no refills. Please see your PCP for further refills.   Your physician recommends that you continue on your current medications as directed. Please refer to the Current Medication list given to you today.  Thank you for choosing [facility]!

## 2014-09-09 NOTE — Progress Notes (Signed)
Cardiology Office Note   Date:  09/09/2014   ID:  Wesley Riley, DOB 1950/06/30, MRN [id number]  PCP:  [name], DO  Cardiologist:  To be est with Branch/ Joni Reining, NP   Chief Complaint  Patient presents with  . Cardiomyopathy  . Hypertension      History of Present Illness: Wesley Riley is a 64 y.o. male who presents for ongoing assessment and management of hypertensive heart disease,systolic dysfunction, with an EF of 35-40%, cardiac catheterization in March of 2016, without evidence of CHF.  He was last seen in the office on 08/12/2014 post hospitalization for chest pain.  He is to be established with Dr. Wyline Mood and has not yet seen him.   On last office visit the patient's blood pressure was not adequately control.  In the setting of systolic dysfunction.  I was more aggressive on his medications at bedtime and increased prolactin and 25 mg daily, hydralazine to 75 mg 3 times a day.  He was counseled on low sodium diet.  He was counseled on smoking cessation.  Consideration for going up on carvedilol dose will be made on this office visit.  His BP was not well controlled.  He is currently at 12.5 mg twice a day.  Today with a list of his blood pressures, which remained elevated, but are very labile.  The patient has a great deal of anxiety and frustration and she is working in the Personnel officer.  Here in the office.  His blood pressure and heart rate are very well controlled at home.  His blood pressure and heart rate are elevated high sensitivity is 160/110 with a heart rate of 97.  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  He often has a headache.  He is on nitrates and should not be having a continual headache after being on this for 3 months.  He has had no improvement in his blood pressure with changes in medication regimen.    Past Medical History  Diagnosis Date  . HTN (hypertension)   .  Tobacco abuse     Past Surgical History  Procedure Laterality Date  . Vasectomy    . Hydrocele excision    . Left heart catheterization with coronary angiogram N/A 06/21/2014    Procedure: LEFT HEART CATHETERIZATION WITH CORONARY ANGIOGRAM;  Surgeon: Kathleene Hazel, MD;  Location: [facility] CATH LAB;  Service: Cardiovascular;  Laterality: N/A;     Current Outpatient Prescriptions  Medication Sig Dispense Refill  . aspirin EC 81 MG EC tablet Take 1 tablet (81 mg total) by mouth daily.    . carvedilol (COREG) 12.5 MG tablet Take 1 tablet (12.5 mg total) by mouth 2 (two) times daily. 180 tablet 3  . furosemide (LASIX) 40 MG tablet Take 1 tablet (40 mg total) by mouth daily. 30 tablet 3  . hydrALAZINE (APRESOLINE) 50 MG tablet Take 1.5 tablets (75 mg total) by mouth 3 (three) times daily. 135 tablet 11  . isosorbide mononitrate (IMDUR) 30 MG 24 hr tablet Take 0.5 tablets (15 mg total) by mouth daily. 30 tablet 3  . spironolactone (ALDACTONE) 25 MG tablet Take 1 tablet (25 mg total) by mouth daily. 90 tablet 3   No current facility-administered medications for this visit.    Allergies:   Penicillins    Social History:  The patient  reports that he has been smoking Cigars and Cigarettes.  He has  a 46 pack-year smoking history. He uses smokeless tobacco.   Family History:  The patient's family history includes Heart disease in an other family member; Stroke in his mother.    ROS: .   All other systems are reviewed and negative.Unless otherwise mentioned in H&P above.   PHYSICAL EXAM: VS:  BP 139/96 mmHg  Pulse 75  Ht 5' 9.5" (1.765 m)  Wt 187 lb 6.4 oz (85.004 kg)  BMI 27.29 kg/m2  SpO2 97% , BMI Body mass index is 27.29 kg/(m^2). GEN: Well nourished, well developed, in no acute distress HEENT: normal Neck: no JVD, carotid bruits, or masses Cardiac: RRR; no murmurs, rubs, or gallops,no edema  Respiratory:  Mild Crackles in the bases,  MS: no deformity or atrophy Skin: warm  and dry, no rash Neuro:  Strength and sensation are intact Psych: euthymic mood, full affect   Recent Labs: 06/20/2014: ALT 13; B Natriuretic Peptide 994.8*; TSH 2.283 06/22/2014: Hemoglobin 11.4*; Magnesium 1.8; Platelets 111* 08/19/2014: BUN 26*; Creatinine 1.29; Potassium 3.9; Sodium 140    Lipid Panel    Component Value Date/Time   CHOL 176 06/20/2014 0316   TRIG 58 06/20/2014 0316   HDL 41 06/20/2014 0316   CHOLHDL 4.3 06/20/2014 0316   VLDL 12 06/20/2014 0316   LDLCALC 123* 06/20/2014 0316      Wt Readings from Last 3 Encounters:  09/09/14 187 lb 6.4 oz (85.004 kg)  08/12/14 185 lb (83.915 kg)  06/30/14 177 lb 6.4 oz (80.468 kg)      Other studies Reviewed: Additional studies/ records that were reviewed today include: Labs from April of 2016.  Review of the above records demonstrates: N/A   ASSESSMENT AND PLAN:  1. Difficult to control hypertension: The patient is on carvedilol 12.5 mg twice a day, hydralazine 75 mg 3 times a day, furosemide 40 mg daily, also, isosorbide 30 mg (15 mg by mouth daily,), and spironolactone 25 mg daily.  Reviewed home blood pressure still elevated blood pressures in the 160s to 170s over 100 -110.  He states that he is taking his medication as directed.he is very judicial concerning medication regimen, and monitoring his blood pressure.  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 He becomes very anxious and frustrated when he is talking better here in the office, which he does in length.condition normalize blood pressure here in the office, with  normal. 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  The patient states that he has taken occasionally, one of his wife's Xanax 0.5 mg, which has helped him feel better, and his blood pressure did return to  normal.  On going to give him Xanax 0.25 mg to take when necessary.  He is only receiving 10 of them.  He is to follow up with Dr. Charm Barges to discuss need to continue if this is been successful for him.  I discussed the possibility of sleep apnea, and ask if he has ever had facility before he says he is not that he does not wish to pursue this test at this time.  I will not add ACE inhibitor as his cardiologist for life, and his creatinine is 1.29.  Consider any amlodipine on next office visit, versus increasing hydralazine to 100 mg 3 times a day.  He will be seen again in 3 months unless  symptomatic.  2. Tobacco abuse: He smokes a pipe.  Which is unfiltered.  He does not wish to quit at this time. Current medicines are reviewed at length with the patient today.    Labs/ tests ordered today include: None No orders of the defined types were placed in this encounter.     Disposition:   FU with 3 months.  Signed, Joni Reining, NP  09/09/2014 2:34 PM    [facility] [address] [place], [place] [address] Phone: [phone]; Fax: [phone]

## 2014-09-30 ENCOUNTER — Other Ambulatory Visit (HOSPITAL_COMMUNITY): Payer: Self-pay

## 2014-09-30 ENCOUNTER — Ambulatory Visit (HOSPITAL_COMMUNITY)
Admission: RE | Admit: 2014-09-30 | Discharge: 2014-09-30 | Disposition: A | Discharge status: Home / Self Care | Payer: Self-pay | Source: Ambulatory Visit | Attending: Physician Assistant | Admitting: Physician Assistant

## 2014-09-30 DIAGNOSIS — Z720 Tobacco use: Secondary | ICD-10-CM | POA: Insufficient documentation

## 2014-09-30 DIAGNOSIS — I509 Heart failure, unspecified: Secondary | ICD-10-CM | POA: Insufficient documentation

## 2014-09-30 DIAGNOSIS — I10 Essential (primary) hypertension: Secondary | ICD-10-CM | POA: Insufficient documentation

## 2014-09-30 DIAGNOSIS — I5020 Unspecified systolic (congestive) heart failure: Secondary | ICD-10-CM

## 2014-10-22 ENCOUNTER — Other Ambulatory Visit: Payer: Self-pay | Admitting: Physician Assistant

## 2014-12-13 ENCOUNTER — Encounter: Payer: Self-pay | Admitting: Cardiovascular Disease

## 2014-12-13 ENCOUNTER — Ambulatory Visit (INDEPENDENT_AMBULATORY_CARE_PROVIDER_SITE_OTHER): Payer: Self-pay | Admitting: Cardiovascular Disease

## 2014-12-13 VITALS — BP 180/110 | HR 60 | Ht 69.5 in | Wt 188.8 lb

## 2014-12-13 DIAGNOSIS — I429 Cardiomyopathy, unspecified: Secondary | ICD-10-CM

## 2014-12-13 DIAGNOSIS — N179 Acute kidney failure, unspecified: Secondary | ICD-10-CM

## 2014-12-13 DIAGNOSIS — N183 Chronic kidney disease, stage 3 (moderate): Secondary | ICD-10-CM

## 2014-12-13 DIAGNOSIS — I472 Ventricular tachycardia: Secondary | ICD-10-CM

## 2014-12-13 DIAGNOSIS — I5022 Chronic systolic (congestive) heart failure: Secondary | ICD-10-CM

## 2014-12-13 DIAGNOSIS — I1310 Hypertensive heart and chronic kidney disease without heart failure, with stage 1 through stage 4 chronic kidney disease, or unspecified chronic kidney disease: Secondary | ICD-10-CM

## 2014-12-13 DIAGNOSIS — I428 Other cardiomyopathies: Secondary | ICD-10-CM

## 2014-12-13 DIAGNOSIS — Z720 Tobacco use: Secondary | ICD-10-CM

## 2014-12-13 DIAGNOSIS — I4729 Other ventricular tachycardia: Secondary | ICD-10-CM

## 2014-12-13 MED ORDER — AMLODIPINE BESYLATE 5 MG PO TABS: 5.0000 mg | ORAL_TABLET | Freq: Every day | ORAL | Status: DC

## 2014-12-13 MED ORDER — FUROSEMIDE 40 MG PO TABS: 40.0000 mg | ORAL_TABLET | Freq: Every day | ORAL | Status: DC | PRN

## 2014-12-13 NOTE — Patient Instructions (Addendum)
Your physician recommends that you schedule a follow-up appointment in: 3 months with Dr [name]   STOP Aspirin   DECREASE Lasix to as needed only   START Amlodipine 5 mg daily   Lab work : BMET   Please keep BP log     Thank you for choosing [facility] !

## 2014-12-13 NOTE — Progress Notes (Signed)
Patient ID: Wesley Riley, male   DOB: 07/16/1950, 64 y.o.   MRN: [id number]      SUBJECTIVE: The patient presents for routine follow-up. This is my first time meeting him. He has a history of a nonischemic cardiopathy deemed to be secondary to hypertensive heart disease, with recalcitrant hypertension. He underwent coronary angiography on 06/21/14 which did not demonstrate any significant CAD. He also has a history of nonsustained ventricular tachycardia and currently takes carvedilol. He has chronic kidney disease and is thus not on an ACE inhibitor or angiotensin II receptor blocker.  LVEF had been 35-40% on 06/20/14 but normalized by echo on 09/30/14, LVEF 55-60%. There was moderate LVH and grade 1 diastolic dysfunction.  He has been taking carvedilol 12.5 mg twice daily, Lasix 40 mg daily, hydralazine 75 mg 3 times daily, and spironolactone 25 mg daily. He typically wakes up between 9:30 and 10 in the morning and takes his morning medications at 11 AM. He then takes medications at 6 PM, and then shortly before bed at 11 or 12. He normally sleeps at 2 AM. He said his blood pressure normalizes within a few hours of taking his medications down to 120/80 and one isolated reading of 96/60. However he notices blood pressure increases by 2 or 3 PM and stays elevated even after taking additional medications.   He denies chest pain, leg swelling, and shortness of breath. He smokes a pipe tobacco and makes his own cigarettes out of them.    Review of Systems: As per "subjective", otherwise negative.  Allergies  Allergen Reactions  . Penicillins Hives and Rash    Current Outpatient Prescriptions  Medication Sig Dispense Refill  . aspirin EC 81 MG EC tablet Take 1 tablet (81 mg total) by mouth daily.    . carvedilol (COREG) 12.5 MG tablet Take 1 tablet (12.5 mg total) by mouth 2 (two) times daily. 180 tablet 3  . furosemide (LASIX) 40 MG tablet TAKE ONE (1) TABLET EACH DAY 30 tablet 6  . hydrALAZINE  (APRESOLINE) 50 MG tablet Take 1.5 tablets (75 mg total) by mouth 3 (three) times daily. 135 tablet 11   No current facility-administered medications for this visit.    Past Medical History  Diagnosis Date  . HTN (hypertension)   . Tobacco abuse     Past Surgical History  Procedure Laterality Date  . Vasectomy    . Hydrocele excision    . Left heart catheterization with coronary angiogram N/A 06/21/2014    Procedure: LEFT HEART CATHETERIZATION WITH CORONARY ANGIOGRAM;  Surgeon: Kathleene Hazel, MD;  Location: [facility] CATH LAB;  Service: Cardiovascular;  Laterality: N/A;    Social History   Social History  . Marital Status: Married    Spouse Name: N/A  . Number of Children: N/A  . Years of Education: N/A   Occupational History  . Not on file.   Social History Main Topics  . Smoking status: Current Every Day Smoker -- 1.00 packs/day for 46 years    Types: Cigars, Cigarettes  . Smokeless tobacco: Current User  . Alcohol Use: Not on file  . Drug Use: Not on file  . Sexual Activity: Not on file   Other Topics Concern  . Not on file   Social History Narrative     Filed Vitals:   12/13/14 1318  BP: 180/110  Pulse: 60  Height: 5' 9.5" (1.765 m)  Weight: 188 lb 12.8 oz (85.639 kg)  SpO2: 97%    PHYSICAL  EXAM General: NAD HEENT: Normal. Neck: No JVD, no thyromegaly. Lungs: Clear to auscultation bilaterally with normal respiratory effort. CV: Nondisplaced PMI.  Regular rate and rhythm, normal S1/S2, no S3/S4, no murmur. No pretibial or periankle edema.  No carotid bruit.   Abdomen: Soft, nontender, no distention.  Neurologic: Alert and oriented x 3.  Psych: Normal affect. Skin: Normal. Musculoskeletal: Normal range of motion, no gross deformities. Extremities: Clubbing of fingers.   ECG: Most recent ECG reviewed.      ASSESSMENT AND PLAN: 1. Malignant hypertension: Markedly elevated on Coreg 12.5 mg bid, spironolactone 25 mg daily, and hydralazine 75 mg  tid. Has not been ACEI/ARB due to CKD. Cannot increase Coreg due to resting HR of 60 bpm.  Will start amlodipine 5 mg daily. If creatinine remains stable, I think ACEI/ARB can be used in the future if need be. Will check BMET. I have asked the patient to check blood pressure readings 4-5 times per week, at different times throughout the day, in order to get a better approximation of mean BP values. These results will be provided to me at the end of that period so that I can determine if antihypertensive medication titration is indicated.  2. Chronic systolic heart failure/nonischemic cardiomyopathy: LVEF normalized by echo in 09/2014. Continue carvedilol. Use Lasix prn. Need to optimally control BP.  3. NSVT: Continue Coreg.  4. CKD stage 3: BUN 26, SCr 1.29 on 08/19/14. SCr had been 1.65 on 06/24/14. Will repeat BMET.  Dispo: f/u 3 months.  Time spent: 40 minutes, of which greater than 50% was spent reviewing symptoms, relevant blood tests and studies, and discussing management plan with the patient.   [name], M.D., F.A.C.C.

## 2015-03-21 ENCOUNTER — Encounter: Payer: Self-pay | Admitting: Cardiovascular Disease

## 2015-03-21 ENCOUNTER — Ambulatory Visit (INDEPENDENT_AMBULATORY_CARE_PROVIDER_SITE_OTHER): Payer: Self-pay | Admitting: Cardiovascular Disease

## 2015-03-21 VITALS — BP 148/92 | HR 73 | Ht 69.0 in | Wt 189.0 lb

## 2015-03-21 DIAGNOSIS — I4729 Other ventricular tachycardia: Secondary | ICD-10-CM

## 2015-03-21 DIAGNOSIS — I1310 Hypertensive heart and chronic kidney disease without heart failure, with stage 1 through stage 4 chronic kidney disease, or unspecified chronic kidney disease: Secondary | ICD-10-CM

## 2015-03-21 DIAGNOSIS — I429 Cardiomyopathy, unspecified: Secondary | ICD-10-CM

## 2015-03-21 DIAGNOSIS — I5022 Chronic systolic (congestive) heart failure: Secondary | ICD-10-CM

## 2015-03-21 DIAGNOSIS — I428 Other cardiomyopathies: Secondary | ICD-10-CM

## 2015-03-21 DIAGNOSIS — I472 Ventricular tachycardia: Secondary | ICD-10-CM

## 2015-03-21 MED ORDER — HYDRALAZINE HCL 100 MG PO TABS: 100.0000 mg | ORAL_TABLET | Freq: Three times a day (TID) | ORAL | Status: DC

## 2015-03-21 NOTE — Patient Instructions (Addendum)
   Increase Hydralazine to 100mg  three times per day - new sent [facility] today. Continue all other medications.   Your physician wants you to follow up in:  1 year.  You will receive a reminder letter in the mail one-two months in advance.  If you don't receive a letter, please call our office to schedule the follow up appointment

## 2015-03-21 NOTE — Progress Notes (Signed)
Patient ID: Wesley Riley, male   DOB: 1951/03/01, 64 y.o.   MRN: [id number]      SUBJECTIVE: The patient presents for routine follow-up. He has a history of a nonischemic cardiomyopathy deemed to be secondary to hypertensive heart disease, with recalcitrant hypertension. He underwent coronary angiography on 06/21/14 which did not demonstrate any significant CAD. He also has a history of nonsustained ventricular tachycardia and currently takes carvedilol. He has chronic kidney disease and is thus not on an ACE inhibitor or angiotensin II receptor blocker.  LVEF had been 35-40% on 06/20/14 but normalized by echo on 09/30/14, LVEF 55-60%. There was moderate LVH and grade 1 diastolic dysfunction.  He denies chest pain, leg swelling, and shortness of breath.  I started amlodipine 5 mg at his last visit but he is no longer taking it as he did not tolerate it.  He has been checking his blood pressure at home with systolic readings routinely between 140 and 145 m mercury. He spoke to me at length and even showed me a video about "Bemer", a pulsed electromagnetic wave mat which reportedly improves capillary perfusion and microcirculation. He says it helped alleviate right shoulder pain altogether and has improved his breathing.   Review of Systems: As per "subjective", otherwise negative.  Allergies  Allergen Reactions  . Penicillins Hives and Rash    Current Outpatient Prescriptions  Medication Sig Dispense Refill  . carvedilol (COREG) 12.5 MG tablet Take 1 tablet (12.5 mg total) by mouth 2 (two) times daily. 180 tablet 3  . furosemide (LASIX) 40 MG tablet Take 1 tablet (40 mg total) by mouth daily as needed. 90 tablet 3  . hydrALAZINE (APRESOLINE) 50 MG tablet Take 1.5 tablets (75 mg total) by mouth 3 (three) times daily. 135 tablet 11  . spironolactone (ALDACTONE) 25 MG tablet Take 25 mg by mouth daily.     No current facility-administered medications for this visit.    Past Medical History    Diagnosis Date  . HTN (hypertension)   . Tobacco abuse     Past Surgical History  Procedure Laterality Date  . Vasectomy    . Hydrocele excision    . Left heart catheterization with coronary angiogram N/A 06/21/2014    Procedure: LEFT HEART CATHETERIZATION WITH CORONARY ANGIOGRAM;  Surgeon: Kathleene Hazel, MD;  Location: [facility] CATH LAB;  Service: Cardiovascular;  Laterality: N/A;    Social History   Social History  . Marital Status: Married    Spouse Name: N/A  . Number of Children: N/A  . Years of Education: N/A   Occupational History  . Not on file.   Social History Main Topics  . Smoking status: Current Every Day Smoker -- 1.00 packs/day for 46 years    Types: Cigars, Cigarettes  . Smokeless tobacco: Current User  . Alcohol Use: Not on file  . Drug Use: Not on file  . Sexual Activity: Not on file   Other Topics Concern  . Not on file   Social History Narrative     Filed Vitals:   03/21/15 1303  BP: 148/92  Pulse: 73  Height:  (1.753 m)  Weight: 189 lb (85.73 kg)  SpO2: 96%    PHYSICAL EXAM General: NAD HEENT: Normal. Neck: No JVD, no thyromegaly. Lungs: Clear to auscultation bilaterally with normal respiratory effort. CV: Nondisplaced PMI.  Regular rate and rhythm, normal S1/S2, no S3/S4, no murmur. No pretibial or periankle edema.  No carotid bruit.  Normal pedal pulses.  Abdomen: Soft, nontender, no hepatosplenomegaly, no distention.  Neurologic: Alert and oriented x 3.  Psych: Normal affect. Skin: Normal. Musculoskeletal: Normal range of motion, no gross deformities. Extremities: No clubbing or cyanosis.   ECG: Most recent ECG reviewed.      ASSESSMENT AND PLAN: 1. Malignant hypertension: Elevated on Coreg 12.5 mg bid, spironolactone 25 mg daily, and hydralazine 75 mg tid. Has not been ACEI/ARB due to CKD. He did not obtain BMET. Will increase hydralazine to 100 mg tid.  2. Chronic systolic heart failure/nonischemic  cardiomyopathy: LVEF normalized by echo in 09/2014. Continue carvedilol. Use Lasix prn. Need to optimally control BP.  3. NSVT: Continue Coreg.  4. CKD stage 3: BUN 26, SCr 1.29 on 08/19/14. SCr had been 1.65 on 06/24/14. He did not obtain BMET.  Dispo: f/u 1 year.   [name], M.D., F.A.C.C.

## 2015-03-21 NOTE — Addendum Note (Signed)
Addended by: Lesle Chris on: 03/21/2015 01:27 PM   Modules accepted: Orders

## 2015-06-08 ENCOUNTER — Telehealth: Payer: Self-pay | Admitting: Internal Medicine

## 2015-06-08 NOTE — Telephone Encounter (Signed)
ON RECALL FOR TCS °

## 2015-06-09 NOTE — Telephone Encounter (Signed)
Letter mailed to pt.  

## 2016-03-07 ENCOUNTER — Encounter (HOSPITAL_COMMUNITY): Payer: Self-pay | Admitting: Emergency Medicine

## 2016-03-07 ENCOUNTER — Emergency Department (HOSPITAL_COMMUNITY): Payer: Medicare Other

## 2016-03-07 ENCOUNTER — Inpatient Hospital Stay (HOSPITAL_COMMUNITY)
Admission: EM | Admit: 2016-03-07 | Discharge: 2016-03-11 | DRG: 291 | Disposition: A | Discharge status: Home / Self Care | Payer: Medicare Other | Attending: Nephrology | Admitting: Nephrology

## 2016-03-07 DIAGNOSIS — I5021 Acute systolic (congestive) heart failure: Secondary | ICD-10-CM | POA: Diagnosis present

## 2016-03-07 DIAGNOSIS — I130 Hypertensive heart and chronic kidney disease with heart failure and stage 1 through stage 4 chronic kidney disease, or unspecified chronic kidney disease: Secondary | ICD-10-CM | POA: Diagnosis not present

## 2016-03-07 DIAGNOSIS — I10 Essential (primary) hypertension: Secondary | ICD-10-CM | POA: Diagnosis present

## 2016-03-07 DIAGNOSIS — I5041 Acute combined systolic (congestive) and diastolic (congestive) heart failure: Secondary | ICD-10-CM | POA: Diagnosis present

## 2016-03-07 DIAGNOSIS — Z880 Allergy status to penicillin: Secondary | ICD-10-CM

## 2016-03-07 DIAGNOSIS — J9601 Acute respiratory failure with hypoxia: Secondary | ICD-10-CM | POA: Diagnosis present

## 2016-03-07 DIAGNOSIS — E876 Hypokalemia: Secondary | ICD-10-CM | POA: Diagnosis present

## 2016-03-07 DIAGNOSIS — Z720 Tobacco use: Secondary | ICD-10-CM | POA: Diagnosis present

## 2016-03-07 DIAGNOSIS — J810 Acute pulmonary edema: Secondary | ICD-10-CM

## 2016-03-07 DIAGNOSIS — N183 Chronic kidney disease, stage 3 (moderate): Secondary | ICD-10-CM | POA: Diagnosis present

## 2016-03-07 DIAGNOSIS — I248 Other forms of acute ischemic heart disease: Secondary | ICD-10-CM | POA: Diagnosis present

## 2016-03-07 DIAGNOSIS — I429 Cardiomyopathy, unspecified: Secondary | ICD-10-CM | POA: Diagnosis present

## 2016-03-07 DIAGNOSIS — I509 Heart failure, unspecified: Secondary | ICD-10-CM

## 2016-03-07 DIAGNOSIS — Z823 Family history of stroke: Secondary | ICD-10-CM

## 2016-03-07 DIAGNOSIS — E1122 Type 2 diabetes mellitus with diabetic chronic kidney disease: Secondary | ICD-10-CM | POA: Diagnosis present

## 2016-03-07 DIAGNOSIS — Z79899 Other long term (current) drug therapy: Secondary | ICD-10-CM

## 2016-03-07 DIAGNOSIS — F17210 Nicotine dependence, cigarettes, uncomplicated: Secondary | ICD-10-CM | POA: Diagnosis present

## 2016-03-07 DIAGNOSIS — I472 Ventricular tachycardia: Secondary | ICD-10-CM | POA: Diagnosis present

## 2016-03-07 LAB — COMPREHENSIVE METABOLIC PANEL
ALT: 46 U/L (ref 17–63)
AST: 71 U/L — AB (ref 15–41)
Albumin: 3.1 g/dL — ABNORMAL LOW (ref 3.5–5.0)
Alkaline Phosphatase: 83 U/L (ref 38–126)
Anion gap: 7 (ref 5–15)
BILIRUBIN TOTAL: 0.4 mg/dL (ref 0.3–1.2)
BUN: 24 mg/dL — AB (ref 6–20)
CO2: 25 mmol/L (ref 22–32)
Calcium: 8.5 mg/dL — ABNORMAL LOW (ref 8.9–10.3)
Chloride: 106 mmol/L (ref 101–111)
Creatinine, Ser: 1.58 mg/dL — ABNORMAL HIGH (ref 0.61–1.24)
GFR calc non Af Amer: 44 mL/min — ABNORMAL LOW (ref >60)
GFR, EST AFRICAN AMERICAN: 51 mL/min — AB (ref >60)
Glucose, Bld: 128 mg/dL — ABNORMAL HIGH (ref 65–99)
POTASSIUM: 3.2 mmol/L — AB (ref 3.5–5.1)
Sodium: 138 mmol/L (ref 135–145)
TOTAL PROTEIN: 6.3 g/dL — AB (ref 6.5–8.1)

## 2016-03-07 LAB — I-STAT TROPONIN, ED: TROPONIN I, POC: 0.11 ng/mL — AB (ref 0.00–0.08)

## 2016-03-07 MED ORDER — FUROSEMIDE 10 MG/ML IJ SOLN
40.0000 mg | Freq: Once | INTRAMUSCULAR | Status: AC
  Administered 2016-03-08: 40 mg via INTRAVENOUS
  Filled 2016-03-07: qty 4

## 2016-03-07 NOTE — ED Triage Notes (Signed)
BIB by [facility] for 1 week of progressive SOB and non-productive cough, worsened tonight.  Diaphoretic, dizzy, pulse ox room air in 60s on scene.  Given 5 mg albuterol duoneb, 0.5 mg atrovent, 125mg  solumedrol.  Upper lobe wheezing bilat before duoneb; rhonchi lower lobes bilat that pt was put on lasix for.  Hx hypertension; was taken off lasix 11 months ago.  No CP or other complaints. 18G in L AC. 222/144 initial, 140/98, 99 hr, resp 24, 90% on 4L en route

## 2016-03-07 NOTE — ED Provider Notes (Signed)
[facility]-EMERGENCY DEPT Provider Note   CSN: [id number] Arrival date & time: 03/07/16  2156     History   Chief Complaint Chief Complaint  Patient presents with  . Shortness of Breath    HPI Wesley Riley is a 65 y.o. male with a hx of HTN, CHF, who states that due to medication side effects he stopped taking all of his rx'd medication about 11 months prior. He states that he was trying some herbal remedies and seemed to be doing fairly well until last week he began noticing progressively worsening DOE, and intermittent non-productive cough similar to that which he had in the past with his previous CHF exacerbation. He was seen in clinic yesterday by his PCP and was restarted on his medications. This evening, however, his condition acutely worsened and he became acutely dyspneic and diaphoretic. He called EMS and on arrival they found him to be hypoxic to the 60's reportedly on room air. He was found to have wheezing and rhonchi on scene and was given duonebs, and solumedrol. His wheezing reportedly improved while en route and he stabilized in the low 90's on 4L by [place]. On arrival to the ED he states that he still feels SOB with any slight exertion, but much improved from prior. He denies any chest pain or tightness. He states that he has had a nonproductive cough and very mild runny nose and initially thought that he was developing a cold, but his sx gradually worsened to this. He denies any significant LE edema, but does note that his abdomen feels a bit bloated.   HPI  Past Medical History:  Diagnosis Date  . CHF (congestive heart failure) (HCC) 03/07/2016   pt sts he was dx within last year   . Complication of anesthesia   . Dyspnea   . HTN (hypertension)   . PONV (postoperative nausea and vomiting)   . Tobacco abuse     Patient Active Problem List   Diagnosis Date Noted  . Acute exacerbation of CHF (congestive heart failure) (HCC) 03/08/2016  . Acute respiratory failure with  hypoxia (HCC) 03/08/2016  . CHF (congestive heart failure) (HCC) 03/08/2016  . Nonischemic cardiomyopathy (HCC) 06/30/2014  . Acute renal failure superimposed on stage 3 chronic kidney disease (HCC) 06/24/2014  . Dyspnea   . Nausea & vomiting 06/22/2014  . Dizziness 06/22/2014  . Congestive heart disease (HCC)   . AKI (acute kidney injury) (HCC)   . Acute systolic heart failure (HCC)   . Acute congestive heart failure (HCC) 06/19/2014  . Tobacco abuse 06/19/2014  . Essential hypertension 06/19/2014  . Hypertensive emergency 06/19/2014  . Elevated troponin 06/19/2014  . Leukocytosis 06/19/2014    Past Surgical History:  Procedure Laterality Date  . HYDROCELE EXCISION    . KNEE SURGERY Right 2013  . LEFT HEART CATHETERIZATION WITH CORONARY ANGIOGRAM N/A 06/21/2014   Procedure: LEFT HEART CATHETERIZATION WITH CORONARY ANGIOGRAM;  Surgeon: Kathleene Hazel, MD;  Location[facility] CATH LAB;  Service: Cardiovascular;  Laterality: N/A;  . VASECTOMY         Home Medications    Prior to Admission medications   Medication Sig Start Date End Date Taking? Authorizing Provider  carvedilol (COREG) 6.25 MG tablet Take 6.25 mg by mouth 2 (two) times daily with a meal.   Yes Historical Provider, MD  furosemide (LASIX) 20 MG tablet Take 20 mg by mouth daily.   Yes Historical Provider, MD  NON FORMULARY Electromagnetic (all-body) beamer/stimulator: Lay on mat 8-12  minutes (per session) twice a day to stimulate vagus nerve activity   Yes Historical Provider, MD  OVER THE COUNTER MEDICATION Colloidal Silver: Take 30 ml's by mouth once a day   Yes Historical Provider, MD  OVER THE COUNTER MEDICATION Essential oils: One drop of peppermint oil applied to the tongue as needed for coughing   Yes Historical Provider, MD  carvedilol (COREG) 12.5 MG tablet Take 1 tablet (12.5 mg total) by mouth 2 (two) times daily. Patient not taking: Reported on 03/07/2016 07/08/14   Antoine Poche, MD  furosemide  (LASIX) 40 MG tablet Take 1 tablet (40 mg total) by mouth daily as needed. Patient not taking: Reported on 03/07/2016 12/13/14   Laqueta Linden, MD  hydrALAZINE (APRESOLINE) 100 MG tablet Take 1 tablet (100 mg total) by mouth 3 (three) times daily. Patient not taking: Reported on 03/07/2016 03/21/15   Laqueta Linden, MD    Family History Family History  Problem Relation Age of Onset  . Stroke Mother   . Heart disease      Social History Social History  Substance Use Topics  . Smoking status: Current Every Day Smoker    Packs/day: 1.00    Years: 46.00    Types: Cigars, Cigarettes  . Smokeless tobacco: Current User  . Alcohol use No     Allergies   Penicillins   Review of Systems Review of Systems  Constitutional: Positive for activity change, diaphoresis and fatigue. Negative for appetite change, chills and fever.  HENT: Positive for congestion and rhinorrhea. Negative for sinus pain, sinus pressure and sore throat.   Respiratory: Positive for cough, shortness of breath and wheezing. Negative for chest tightness.   Cardiovascular: Negative for chest pain, palpitations and leg swelling.  Gastrointestinal: Positive for abdominal distention. Negative for abdominal pain, diarrhea, nausea and vomiting.  Genitourinary: Negative for difficulty urinating and dysuria.  Skin: Negative for rash and wound.  Neurological: Negative for syncope, weakness, light-headedness, numbness and headaches.  All other systems reviewed and are negative.    Physical Exam Updated Vital Signs BP (!) 140/92 (BP Location: Right Arm)   Pulse 76   Temp 97.3 F (36.3 C) (Oral)   Resp 20   Ht 5\' 9"  (1.753 m)   Wt 80.6 kg   SpO2 96%   BMI 26.24 kg/m   Physical Exam  Constitutional: He is oriented to person, place, and time. He appears well-developed and well-nourished. No distress.  HENT:  Head: Normocephalic and atraumatic.  Nose: Nose normal.  Mouth/Throat: Oropharynx is clear and  moist.  Eyes: Conjunctivae and EOM are normal. Pupils are equal, round, and reactive to light.  Neck: Neck supple. JVD present.  Cardiovascular: Normal rate, regular rhythm, normal heart sounds and intact distal pulses.   Pulmonary/Chest: Effort normal. No respiratory distress. He has no wheezes. He has rales in the right middle field, the right lower field, the left middle field and the left lower field. He exhibits no tenderness.  Abdominal: Soft. He exhibits no distension. There is no tenderness.  Musculoskeletal: He exhibits no edema or tenderness.  Neurological: He is alert and oriented to person, place, and time. No cranial nerve deficit or sensory deficit. He exhibits normal muscle tone. Coordination normal.  Skin: Skin is warm and dry. No rash noted. He is not diaphoretic.  Nursing note and vitals reviewed.    ED Treatments / Results  Labs (all labs ordered are listed, but only abnormal results are displayed) Labs Reviewed  CBC  WITH DIFFERENTIAL/PLATELET - Abnormal; Notable for the following:       Result Value   WBC 12.4 (*)    RBC 3.98 (*)    HCT 37.9 (*)    Neutro Abs 10.8 (*)    All other components within normal limits  COMPREHENSIVE METABOLIC PANEL - Abnormal; Notable for the following:    Potassium 3.2 (*)    Glucose, Bld 128 (*)    BUN 24 (*)    Creatinine, Ser 1.58 (*)    Calcium 8.5 (*)    Total Protein 6.3 (*)    Albumin 3.1 (*)    AST 71 (*)    GFR calc non Af Amer 44 (*)    GFR calc Af Amer 51 (*)    All other components within normal limits  BRAIN NATRIURETIC PEPTIDE - Abnormal; Notable for the following:    B Natriuretic Peptide 1,082.8 (*)    All other components within normal limits  CREATININE, SERUM - Abnormal; Notable for the following:    Creatinine, Ser 1.48 (*)    GFR calc non Af Amer 48 (*)    GFR calc Af Amer 56 (*)    All other components within normal limits  TROPONIN I - Abnormal; Notable for the following:    Troponin I 0.33 (*)     All other components within normal limits  TROPONIN I - Abnormal; Notable for the following:    Troponin I 0.21 (*)    All other components within normal limits  I-STAT TROPOININ, ED - Abnormal; Notable for the following:    Troponin i, poc 0.11 (*)    All other components within normal limits  CBC  TSH  TROPONIN I    EKG  EKG Interpretation  Date/Time:  Wednesday March 07 2016 22:04:52 EST Ventricular Rate:  94 PR Interval:    QRS Duration: 107 QT Interval:  375 QTC Calculation: 469 R Axis:   4 Text Interpretation:  Sinus rhythm Left atrial enlargement LVH with secondary repolarization abnormality TWI in the inferior and lateral leads are not new Confirmed by Rhunette Croft, MD, Janey Genta [phone]) on 03/07/2016 11:15:07 PM       Radiology Dg Chest 2 View  Result Date: 03/07/2016 CLINICAL DATA:  Shortness of breath and cough.  History of CHF. EXAM: CHEST  2 VIEW COMPARISON:  Chest radiograph June 21, 2014 FINDINGS: Cardiac silhouette is moderately enlarged, similar. Mildly tortuous atherosclerotic aorta. Pulmonary vascular congestion mild interstitial prominence with LEFT lung base strandy densities. No pleural effusion or focal consolidation here lead no pneumothorax. Soft tissue planes and included osseous structure nonsuspicious. IMPRESSION: Cardiomegaly, pulmonary vascular congestion and mild interstitial prominence concerning for pulmonary edema. Bibasilar atelectasis. Electronically Signed   By: Awilda Metro M.D.   On: 03/07/2016 23:05    Procedures Procedures (including critical care time)  Medications Ordered in ED Medications  carvedilol (COREG) tablet 6.25 mg (6.25 mg Oral Given 03/08/16 1033)  acetaminophen (TYLENOL) tablet 650 mg (not administered)    Or  acetaminophen (TYLENOL) suppository 650 mg (not administered)  ondansetron (ZOFRAN) tablet 4 mg (not administered)    Or  ondansetron (ZOFRAN) injection 4 mg (not administered)  enoxaparin (LOVENOX)  injection 40 mg (not administered)  furosemide (LASIX) injection 40 mg (40 mg Intravenous Given 03/08/16 1032)  potassium chloride (K-DUR,KLOR-CON) CR tablet 10 mEq (10 mEq Oral Given 03/08/16 1032)  aspirin EC tablet 325 mg (325 mg Oral Given 03/08/16 1032)  furosemide (LASIX) injection 40 mg (40 mg Intravenous Given  03/08/16 0015)  potassium chloride (KLOR-CON) packet 40 mEq (40 mEq Oral Given 03/08/16 0252)     Initial Impression / Assessment and Plan / ED Course  I have reviewed the triage vital signs and the nursing notes.  Pertinent labs & imaging results that were available during my care of the patient were reviewed by me and considered in my medical decision making (see chart for details).  Clinical Course    65 y.o. male with hx of CHF with noncompliance with his rx'd medications presents with a CHF exacerbation and new oxygen demand. Labs shows significantly elevated BNP and CXR shows edema. Troponin 0.11. EKG shows TWI inferiolaterally, which are similar to previous. No chest pain. He was given 40mg  of IV lasix and potassium. He was then admitted to the hospitalist service for further care and assessment   Final Clinical Impressions(s) / ED Diagnoses   Final diagnoses:  Acute congestive heart failure, unspecified congestive heart failure type (HCC)  Acute pulmonary edema [facility](HCC)    New Prescriptions Current Discharge Medication List       Francoise CeoWarren S Foy Mungia, DO 03/08/16 1231    Derwood KaplanAnkit Nanavati, MD 03/09/16 2131

## 2016-03-08 ENCOUNTER — Encounter (HOSPITAL_COMMUNITY): Payer: Self-pay | Admitting: Internal Medicine

## 2016-03-08 DIAGNOSIS — F17210 Nicotine dependence, cigarettes, uncomplicated: Secondary | ICD-10-CM | POA: Diagnosis present

## 2016-03-08 DIAGNOSIS — I5041 Acute combined systolic (congestive) and diastolic (congestive) heart failure: Secondary | ICD-10-CM | POA: Diagnosis not present

## 2016-03-08 DIAGNOSIS — J9601 Acute respiratory failure with hypoxia: Secondary | ICD-10-CM | POA: Diagnosis present

## 2016-03-08 DIAGNOSIS — I429 Cardiomyopathy, unspecified: Secondary | ICD-10-CM | POA: Diagnosis present

## 2016-03-08 DIAGNOSIS — R748 Abnormal levels of other serum enzymes: Secondary | ICD-10-CM | POA: Diagnosis not present

## 2016-03-08 DIAGNOSIS — I248 Other forms of acute ischemic heart disease: Secondary | ICD-10-CM | POA: Diagnosis present

## 2016-03-08 DIAGNOSIS — I428 Other cardiomyopathies: Secondary | ICD-10-CM | POA: Diagnosis not present

## 2016-03-08 DIAGNOSIS — E876 Hypokalemia: Secondary | ICD-10-CM | POA: Diagnosis present

## 2016-03-08 DIAGNOSIS — Z720 Tobacco use: Secondary | ICD-10-CM | POA: Diagnosis not present

## 2016-03-08 DIAGNOSIS — E1122 Type 2 diabetes mellitus with diabetic chronic kidney disease: Secondary | ICD-10-CM | POA: Diagnosis present

## 2016-03-08 DIAGNOSIS — Z823 Family history of stroke: Secondary | ICD-10-CM | POA: Diagnosis not present

## 2016-03-08 DIAGNOSIS — I10 Essential (primary) hypertension: Secondary | ICD-10-CM | POA: Diagnosis not present

## 2016-03-08 DIAGNOSIS — J810 Acute pulmonary edema: Secondary | ICD-10-CM | POA: Diagnosis present

## 2016-03-08 DIAGNOSIS — N183 Chronic kidney disease, stage 3 (moderate): Secondary | ICD-10-CM | POA: Diagnosis not present

## 2016-03-08 DIAGNOSIS — Z79899 Other long term (current) drug therapy: Secondary | ICD-10-CM | POA: Diagnosis not present

## 2016-03-08 DIAGNOSIS — I5021 Acute systolic (congestive) heart failure: Secondary | ICD-10-CM | POA: Diagnosis not present

## 2016-03-08 DIAGNOSIS — I130 Hypertensive heart and chronic kidney disease with heart failure and stage 1 through stage 4 chronic kidney disease, or unspecified chronic kidney disease: Secondary | ICD-10-CM | POA: Diagnosis present

## 2016-03-08 DIAGNOSIS — I509 Heart failure, unspecified: Secondary | ICD-10-CM

## 2016-03-08 DIAGNOSIS — Z880 Allergy status to penicillin: Secondary | ICD-10-CM | POA: Diagnosis not present

## 2016-03-08 DIAGNOSIS — I472 Ventricular tachycardia: Secondary | ICD-10-CM | POA: Diagnosis present

## 2016-03-08 DIAGNOSIS — I5043 Acute on chronic combined systolic (congestive) and diastolic (congestive) heart failure: Secondary | ICD-10-CM | POA: Diagnosis not present

## 2016-03-08 LAB — CBC WITH DIFFERENTIAL/PLATELET
BASOS PCT: 0 %
Basophils Absolute: 0 10*3/uL (ref 0.0–0.1)
EOS PCT: 1 %
Eosinophils Absolute: 0.1 10*3/uL (ref 0.0–0.7)
HCT: 37.9 % — ABNORMAL LOW (ref 39.0–52.0)
Hemoglobin: 13.3 g/dL (ref 13.0–17.0)
LYMPHS ABS: 1 10*3/uL (ref 0.7–4.0)
Lymphocytes Relative: 8 %
MCH: 33.4 pg (ref 26.0–34.0)
MCHC: 35.1 g/dL (ref 30.0–36.0)
MCV: 95.2 fL (ref 78.0–100.0)
MONO ABS: 0.5 10*3/uL (ref 0.1–1.0)
Monocytes Relative: 4 %
NEUTROS ABS: 10.8 10*3/uL — AB (ref 1.7–7.7)
Neutrophils Relative %: 87 %
PLATELETS: 179 10*3/uL (ref 150–400)
RBC: 3.98 MIL/uL — ABNORMAL LOW (ref 4.22–5.81)
RDW: 13.6 % (ref 11.5–15.5)
WBC: 12.4 10*3/uL — ABNORMAL HIGH (ref 4.0–10.5)

## 2016-03-08 LAB — CBC
HCT: 39.9 % (ref 39.0–52.0)
Hemoglobin: 13.9 g/dL (ref 13.0–17.0)
MCH: 32.9 pg (ref 26.0–34.0)
MCHC: 34.8 g/dL (ref 30.0–36.0)
MCV: 94.5 fL (ref 78.0–100.0)
Platelets: 181 10*3/uL (ref 150–400)
RBC: 4.22 MIL/uL (ref 4.22–5.81)
RDW: 13.6 % (ref 11.5–15.5)
WBC: 6.7 10*3/uL (ref 4.0–10.5)

## 2016-03-08 LAB — TROPONIN I
TROPONIN I: 0.19 ng/mL — AB (ref <0.03)
Troponin I: 0.33 ng/mL (ref <0.03)
Troponin I: 0.21 ng/mL (ref <0.03)

## 2016-03-08 LAB — CREATININE, SERUM
CREATININE: 1.48 mg/dL — AB (ref 0.61–1.24)
GFR calc Af Amer: 56 mL/min — ABNORMAL LOW (ref >60)
GFR calc non Af Amer: 48 mL/min — ABNORMAL LOW (ref >60)

## 2016-03-08 LAB — TSH: TSH: 1.744 u[IU]/mL (ref 0.350–4.500)

## 2016-03-08 LAB — BRAIN NATRIURETIC PEPTIDE: B NATRIURETIC PEPTIDE 5: 1082.8 pg/mL — AB (ref 0.0–100.0)

## 2016-03-08 MED ORDER — CARVEDILOL 6.25 MG PO TABS
6.2500 mg | ORAL_TABLET | Freq: Two times a day (BID) | ORAL | Status: DC
  Administered 2016-03-08 – 2016-03-11 (×7): 6.25 mg via ORAL
  Filled 2016-03-08 (×7): qty 1

## 2016-03-08 MED ORDER — ACETAMINOPHEN 325 MG PO TABS: 650.0000 mg | ORAL_TABLET | Freq: Four times a day (QID) | ORAL | Status: DC | PRN

## 2016-03-08 MED ORDER — POTASSIUM CHLORIDE CRYS ER 10 MEQ PO TBCR
10.0000 meq | EXTENDED_RELEASE_TABLET | Freq: Every day | ORAL | Status: DC
  Administered 2016-03-08 – 2016-03-09 (×2): 10 meq via ORAL
  Filled 2016-03-08 (×2): qty 1

## 2016-03-08 MED ORDER — ONDANSETRON HCL 4 MG PO TABS: 4.0000 mg | ORAL_TABLET | Freq: Four times a day (QID) | ORAL | Status: DC | PRN

## 2016-03-08 MED ORDER — ACETAMINOPHEN 650 MG RE SUPP: 650.0000 mg | Freq: Four times a day (QID) | RECTAL | Status: DC | PRN

## 2016-03-08 MED ORDER — POTASSIUM CHLORIDE 20 MEQ PO PACK
40.0000 meq | PACK | Freq: Once | ORAL | Status: AC
  Administered 2016-03-08: 40 meq via ORAL
  Filled 2016-03-08: qty 2

## 2016-03-08 MED ORDER — ENOXAPARIN SODIUM 40 MG/0.4ML ~~LOC~~ SOLN
40.0000 mg | Freq: Every day | SUBCUTANEOUS | Status: DC
  Filled 2016-03-08 (×2): qty 0.4

## 2016-03-08 MED ORDER — GUAIFENESIN-DM 100-10 MG/5ML PO SYRP: 5.0000 mL | ORAL_SOLUTION | ORAL | Status: DC | PRN

## 2016-03-08 MED ORDER — ASPIRIN EC 325 MG PO TBEC
325.0000 mg | DELAYED_RELEASE_TABLET | Freq: Every day | ORAL | Status: DC
  Administered 2016-03-08 – 2016-03-09 (×2): 325 mg via ORAL
  Filled 2016-03-08 (×4): qty 1

## 2016-03-08 MED ORDER — LISINOPRIL 2.5 MG PO TABS: 2.5000 mg | ORAL_TABLET | Freq: Every day | ORAL | Status: DC

## 2016-03-08 MED ORDER — FUROSEMIDE 10 MG/ML IJ SOLN
40.0000 mg | Freq: Every day | INTRAMUSCULAR | Status: DC
  Administered 2016-03-08 – 2016-03-09 (×2): 40 mg via INTRAVENOUS
  Filled 2016-03-08 (×2): qty 4

## 2016-03-08 MED ORDER — ONDANSETRON HCL 4 MG/2ML IJ SOLN: 4.0000 mg | Freq: Four times a day (QID) | INTRAMUSCULAR | Status: DC | PRN

## 2016-03-08 NOTE — Progress Notes (Signed)
Patient Troponin level 0.33, MD notified.

## 2016-03-08 NOTE — ED Notes (Signed)
Attempted report x1.  Name and callback number provided.   

## 2016-03-08 NOTE — H&P (Signed)
History and Physical    Wesley Riley ZOX:[id number] DOB: 17-Dec-1950 DOA: 03/07/2016  PCP: [name], DO  Patient coming from: Home.  Chief Complaint: Shortness of breath.  HPI: Wesley Riley is a 65 y.o. male with known history of nonischemic cardiomyopathy diagnosed last year and at that time patient had undergone cardiac cath and coronary unremarkable presents to the ER because of worsening shortness of breath over the last 1 week. Shortness of breath is present on lying down. Increase on exertion. Denies any chest pain. Denies any fever chills or cough. Patient has not been taking his medications for last few months and has been only taking herbal medications Due to worsening shortness of breath patient had gone to his PCP who at start patient on Lasix and Coreg yesterday. Patient had taken 1 dose of each and had presented to the ER because of worsening symptoms. Chest x-ray shows congestion and troponin is mildly elevated with markedly elevated BNP.   ED Course: Patient was given Lasix 40 mg IV in the ER.  Review of Systems: As per HPI, rest all negative.   Past Medical History:  Diagnosis Date  . CHF (congestive heart failure) (HCC) 03/07/2016   pt sts he was dx within last year   . HTN (hypertension)   . Tobacco abuse     Past Surgical History:  Procedure Laterality Date  . HYDROCELE EXCISION    . KNEE SURGERY Right 2013  . LEFT HEART CATHETERIZATION WITH CORONARY ANGIOGRAM N/A 06/21/2014   Procedure: LEFT HEART CATHETERIZATION WITH CORONARY ANGIOGRAM;  Surgeon: Kathleene Hazel, MD;  Location: [facility] CATH LAB;  Service: Cardiovascular;  Laterality: N/A;  . VASECTOMY       reports that he has been smoking Cigars and Cigarettes.  He has a 46.00 pack-year smoking history. He uses smokeless tobacco. His alcohol and drug histories are not on file.  Allergies  Allergen Reactions  . Penicillins Hives and Rash    Has patient had a PCN reaction causing immediate rash,  facial/tongue/throat swelling, SOB or lightheadedness with hypotension: Yes Has patient had a PCN reaction causing severe rash involving mucus membranes or skin necrosis: No Has patient had a PCN reaction that required hospitalization: No Has patient had a PCN reaction occurring within the last 10 years: No If all of the above answers are "NO", then may proceed with Cephalosporin use.     Family History  Problem Relation Age of Onset  . Stroke Mother   . Heart disease      Prior to Admission medications   Medication Sig Start Date End Date Taking? Authorizing Provider  carvedilol (COREG) 6.25 MG tablet Take 6.25 mg by mouth 2 (two) times daily with a meal.   Yes Historical Provider, MD  furosemide (LASIX) 20 MG tablet Take 20 mg by mouth daily.   Yes Historical Provider, MD  NON FORMULARY Electromagnetic (all-body) beamer/stimulator: Lay on mat 8-12 minutes (per session) twice a day to stimulate vagus nerve activity   Yes Historical Provider, MD  OVER THE COUNTER MEDICATION Colloidal Silver: Take 30 ml's by mouth once a day   Yes Historical Provider, MD  OVER THE COUNTER MEDICATION Essential oils: One drop of peppermint oil applied to the tongue as needed for coughing   Yes Historical Provider, MD  carvedilol (COREG) 12.5 MG tablet Take 1 tablet (12.5 mg total) by mouth 2 (two) times daily. Patient not taking: Reported on 03/07/2016 07/08/14   Antoine Poche, MD  furosemide (  LASIX) 40 MG tablet Take 1 tablet (40 mg total) by mouth daily as needed. Patient not taking: Reported on 03/07/2016 12/13/14   Laqueta Linden, MD  hydrALAZINE (APRESOLINE) 100 MG tablet Take 1 tablet (100 mg total) by mouth 3 (three) times daily. Patient not taking: Reported on 03/07/2016 03/21/15   Laqueta Linden, MD    Physical Exam: Vitals:   03/08/16 0300 03/08/16 0430 03/08/16 0431 03/08/16 0500  BP: 139/92 126/88 126/88 127/88  Pulse: 94 96 94 94  Resp: 21 19 18 19   SpO2: 97% 92% 96% 91%    Weight:      Height:          Constitutional: Moderately built and nourished. Vitals:   03/08/16 0300 03/08/16 0430 03/08/16 0431 03/08/16 0500  BP: 139/92 126/88 126/88 127/88  Pulse: 94 96 94 94  Resp: 21 19 18 19   SpO2: 97% 92% 96% 91%  Weight:      Height:       Eyes: Anicteric no pallor. ENMT: No discharge from the ears eyes nose or mouth. Neck: JVD elevated no mass felt. Respiratory: No rhonchi or crepitations. Cardiovascular: S1-S2 heard. No murmurs appreciated. Abdomen: Soft nontender bowel sounds present. No guarding or rigidity. Musculoskeletal: No edema. No joint effusion. Skin: No rash. Skin appears warm. Neurologic: Alert awake oriented to time place and person. Moves all extremities. Psychiatric: Appears normal. Normal affect.   Labs on Admission: I have personally reviewed following labs and imaging studies  CBC:  Recent Labs Lab 03/07/16 2302  WBC 12.4*  NEUTROABS 10.8*  HGB 13.3  HCT 37.9*  MCV 95.2  PLT 179   Basic Metabolic Panel:  Recent Labs Lab 03/07/16 2302  NA 138  K 3.2*  CL 106  CO2 25  GLUCOSE 128*  BUN 24*  CREATININE 1.58*  CALCIUM 8.5*   GFR: Estimated Creatinine Clearance: 47.4 mL/min (by C-G formula based on SCr of 1.58 mg/dL (H)). Liver Function Tests:  Recent Labs Lab 03/07/16 2302  AST 71*  ALT 46  ALKPHOS 83  BILITOT 0.4  PROT 6.3*  ALBUMIN 3.1*   No results for input(s): LIPASE, AMYLASE in the last 168 hours. No results for input(s): AMMONIA in the last 168 hours. Coagulation Profile: No results for input(s): INR, PROTIME in the last 168 hours. Cardiac Enzymes: No results for input(s): CKTOTAL, CKMB, CKMBINDEX, TROPONINI in the last 168 hours. BNP (last 3 results) No results for input(s): PROBNP in the last 8760 hours. HbA1C: No results for input(s): HGBA1C in the last 72 hours. CBG: No results for input(s): GLUCAP in the last 168 hours. Lipid Profile: No results for input(s): CHOL, HDL, LDLCALC,  TRIG, CHOLHDL, LDLDIRECT in the last 72 hours. Thyroid Function Tests: No results for input(s): TSH, T4TOTAL, FREET4, T3FREE, THYROIDAB in the last 72 hours. Anemia Panel: No results for input(s): VITAMINB12, FOLATE, FERRITIN, TIBC, IRON, RETICCTPCT in the last 72 hours. Urine analysis: No results found for: COLORURINE, APPEARANCEUR, LABSPEC, PHURINE, GLUCOSEU, HGBUR, BILIRUBINUR, KETONESUR, PROTEINUR, UROBILINOGEN, NITRITE, LEUKOCYTESUR Sepsis Labs: @LABRCNTIP (procalcitonin:4,lacticidven:4) )No results found for this or any previous visit (from the past 240 hour(s)).   Radiological Exams on Admission: Dg Chest 2 View  Result Date: 03/07/2016 CLINICAL DATA:  Shortness of breath and cough.  History of CHF. EXAM: CHEST  2 VIEW COMPARISON:  Chest radiograph June 21, 2014 FINDINGS: Cardiac silhouette is moderately enlarged, similar. Mildly tortuous atherosclerotic aorta. Pulmonary vascular congestion mild interstitial prominence with LEFT lung base strandy densities. No pleural effusion  or focal consolidation here lead no pneumothorax. Soft tissue planes and included osseous structure nonsuspicious. IMPRESSION: Cardiomegaly, pulmonary vascular congestion and mild interstitial prominence concerning for pulmonary edema. Bibasilar atelectasis. Electronically Signed   By: Awilda Metroourtnay  Bloomer M.D.   On: 03/07/2016 23:05    EKG: Independently reviewed. Normal sinus rhythm with LVH and T-wave changes.  Assessment/Plan Active Problems:   Tobacco abuse   Essential hypertension   Acute systolic heart failure (HCC)   Acute respiratory failure with hypoxia (HCC)   CHF (congestive heart failure) (HCC)    1. Acute respiratory failure with hypoxia secondary to acute decompensated CHF - patient's EF on February 2016 was 35-40% which subsequently improved to 55-60% in June 2016. Cardiac catheter done in 2016 February was unremarkable. Patient is placed on Lasix 40 mg IV daily now. Patient so far has had  2500 mL output. No ACE inhibitor as or ARB due to renal failure. Patient during last admission last year was on BiDil. Presently blood pressure is in the low normal. Closely observe. Recheck 2-D echo. Check d-dimer. Cycle cardiac markers. Patient already had taken Coreg which will be continued. 2. Elevated troponin probably from CHF - cycle cardiac markers. Patient is on aspirin. Cardiac cath was unremarkable last year. 3. Hypertension - patient has history of hypertension. Presently blood pressure is in the low normal. Closely observe for any room for BiDil. 4. Renal failure probably chronic stage III - closely follow metabolic panel. 5. Tobacco abuse - patient advised to quit smoking.   DVT prophylaxis: Lovenox. Code Status: Full code.  Family Communication: Patient's wife.  Disposition Plan: Home.  Consults called: None.  Admission status: Inpatient. Likely stage II to 3 days.    Eduard ClosKAKRAKANDY,ARSHAD N. MD [facility] Pager [phone].  If 7PM-7AM, please contact night-coverage [email or web address] Password [email or web address]  03/08/2016, 5:16 AM

## 2016-03-08 NOTE — Progress Notes (Signed)
PROGRESS NOTE                                                                                                                                                                                                             Patient Demographics:    Wesley Riley, is a 65 y.o. male, DOB - 1950/10/17, JGO:[id number]  Admit date - 03/07/2016   Admitting Physician Eduard Clos, MD  Outpatient Primary MD for the patient is [name], DO  LOS - 0   Admitted this morning.  Outpatient Specialists:None  Chief Complaint  Patient presents with  . Shortness of Breath       Brief Narrative   65 year old male with nonischemic cardiomyopathy diagnosed in 2016, underwent cardiac cath with clean coronaries presented to the ED with worsening shortness of breath for past 1 week. Associated orthopnea. Symptoms worsened on exertion. Denied chest pain, fever, palpitation or chills. Reports nonproductive cough. Patient stopped taking his medications for 6 months stating they have not helped with his symptoms and that when he did personal research they had drug interactions. Patient went to see his PCP on the day of admission and was prescribed Lasix and Coreg. In the ED patient was tachypneic with elevated blood pressure. Blood work showed WBC of 12.4, potassium of 3.2 and creatinine 1.58.Marland Kitchen BNP of 1028. Chest x-ray showed pulmonary vascular congestion Patient given 40 mg IV Lasix with >2 L diuresed. Admitted to hospitalist service for acute CHF exacerbation.   Subjective:   Patient informs his breathing to be better since admission but has ongoing nonproductive cough.   Assessment  & Plan :     Principal problem   Acute diastolic congestive heart failure (HCC) Monitor on telemetry. Diurese with IV Lasix 40 mg daily. (Diabetes quite well with single dose of IV Lasix).. Strict I/O and daily weight. Added daily aspirin,, Coreg.  Cannot use ACE inhibitor/ARB due to chronic kidney disease. Check 2-D echo. Patient counseled on last modification and medication adherence. Patient informed that he will think about taking all the medications that will be prescribed.    Active Problems:   Acute respiratory failure with hypoxia (HCC) Secondary to acute systolic CHF. Continue O2 via nasal cannula.     Tobacco abuse Counseled strongly on cessation. Patient undecided if he wants to quit.    Essential hypertension Stable.  Monitor with Coreg and Lasix.  Elevated troponin Likely demand ischemia secondary to acute CHF. No chest pain symptoms.  Chronic kidney disease stage II-III Baseline creatinine 1.4-1.6.   Code Status : Full code  Family Communication  : Wife at bedside  Disposition Plan  : Home possibly in the next 24-48 hours if diet is well and symptoms better  Barriers For Discharge : Active symptoms  Consults  :  None  Procedures  : 2-D echo  DVT Prophylaxis  :  Lovenox -   Lab Results  Component Value Date   PLT 181 03/08/2016    Antibiotics  :    Anti-infectives    None        Objective:   Vitals:   03/08/16 0500 03/08/16 0604 03/08/16 0758 03/08/16 1510  BP: 127/88 138/87 (!) 140/92 (!) 142/85  Pulse: 94 82 76 73  Resp: 19 18 20 20   Temp:  97.7 F (36.5 C) 97.3 F (36.3 C) 97.5 F (36.4 C)  TempSrc:  Oral Oral Oral  SpO2: 91% 96% 96% 94%  Weight:  80.6 kg (177 lb 11.2 oz)    Height:  5\' 9"  (1.753 m)      Wt Readings from Last 3 Encounters:  03/08/16 80.6 kg (177 lb 11.2 oz)  03/21/15 85.7 kg (189 lb)  12/13/14 85.6 kg (188 lb 12.8 oz)     Intake/Output Summary (Last 24 hours) at 03/08/16 1515 Last data filed at 03/08/16 1511  Gross per 24 hour  Intake              480 ml  Output             2800 ml  Net            -2320 ml     Physical Exam  Gen: not in distress HEENT:  moist mucosa, supple neck Chest:9 bibasilar crackles, CVS: N S1&S2, no murmurs, rubs or  gallop GI: soft, NT, ND, Musculoskeletal: warm, no edema     Data Review:    CBC  Recent Labs Lab 03/07/16 2302 03/08/16 0537  WBC 12.4* 6.7  HGB 13.3 13.9  HCT 37.9* 39.9  PLT 179 181  MCV 95.2 94.5  MCH 33.4 32.9  MCHC 35.1 34.8  RDW 13.6 13.6  LYMPHSABS 1.0  --   MONOABS 0.5  --   EOSABS 0.1  --   BASOSABS 0.0  --     Chemistries   Recent Labs Lab 03/07/16 2302 03/08/16 0537  NA 138  --   K 3.2*  --   CL 106  --   CO2 25  --   GLUCOSE 128*  --   BUN 24*  --   CREATININE 1.58* 1.48*  CALCIUM 8.5*  --   AST 71*  --   ALT 46  --   ALKPHOS 83  --   BILITOT 0.4  --    ------------------------------------------------------------------------------------------------------------------ No results for input(s): CHOL, HDL, LDLCALC, TRIG, CHOLHDL, LDLDIRECT in the last 72 hours.  Lab Results  Component Value Date   HGBA1C 5.8 (H) 06/20/2014   ------------------------------------------------------------------------------------------------------------------  Recent Labs  03/08/16 0537  TSH 1.744   ------------------------------------------------------------------------------------------------------------------ No results for input(s): VITAMINB12, FOLATE, FERRITIN, TIBC, IRON, RETICCTPCT in the last 72 hours.  Coagulation profile No results for input(s): INR, PROTIME in the last 168 hours.  No results for input(s): DDIMER in the last 72 hours.  Cardiac Enzymes  Recent Labs Lab 03/08/16 0537 03/08/16 1107  TROPONINI 0.33*  0.21*   ------------------------------------------------------------------------------------------------------------------    Component Value Date/Time   BNP 1,082.8 (H) 03/07/2016 2308    Inpatient Medications  Scheduled Meds: . aspirin EC  325 mg Oral Daily  . carvedilol  6.25 mg Oral BID WC  . enoxaparin (LOVENOX) injection  40 mg Subcutaneous Daily  . furosemide  40 mg Intravenous Daily  . potassium chloride  10 mEq  Oral Daily   Continuous Infusions: PRN Meds:.acetaminophen **OR** acetaminophen, ondansetron **OR** ondansetron (ZOFRAN) IV  Micro Results No results found for this or any previous visit (from the past 240 hour(s)).  Radiology Reports Dg Chest 2 View  Result Date: 03/07/2016 CLINICAL DATA:  Shortness of breath and cough.  History of CHF. EXAM: CHEST  2 VIEW COMPARISON:  Chest radiograph June 21, 2014 FINDINGS: Cardiac silhouette is moderately enlarged, similar. Mildly tortuous atherosclerotic aorta. Pulmonary vascular congestion mild interstitial prominence with LEFT lung base strandy densities. No pleural effusion or focal consolidation here lead no pneumothorax. Soft tissue planes and included osseous structure nonsuspicious. IMPRESSION: Cardiomegaly, pulmonary vascular congestion and mild interstitial prominence concerning for pulmonary edema. Bibasilar atelectasis. Electronically Signed   By: Awilda Metroourtnay  Bloomer M.D.   On: 03/07/2016 23:05    Time Spent in minutes  20   Eddie NorthHUNGEL, Kareen Hitsman M.D on 03/08/2016 at 3:15 PM  Between 7am to 7pm - Pager - [phone]  After 7pm go to [email or web address] - password [email or web address]  [facility] -  Office  [phone]

## 2016-03-08 NOTE — ED Notes (Signed)
Admitting provider at bedside.

## 2016-03-08 NOTE — Progress Notes (Signed)
Patient arrived in the unit accompanied by RN and patient's spouse. Orientation to the unit given. Patient verbalizes understanding.

## 2016-03-09 ENCOUNTER — Inpatient Hospital Stay (HOSPITAL_COMMUNITY): Payer: Medicare Other

## 2016-03-09 DIAGNOSIS — I10 Essential (primary) hypertension: Secondary | ICD-10-CM

## 2016-03-09 DIAGNOSIS — I509 Heart failure, unspecified: Secondary | ICD-10-CM

## 2016-03-09 DIAGNOSIS — Z720 Tobacco use: Secondary | ICD-10-CM

## 2016-03-09 DIAGNOSIS — J9601 Acute respiratory failure with hypoxia: Secondary | ICD-10-CM

## 2016-03-09 LAB — ECHOCARDIOGRAM COMPLETE
EWDT: 194 ms
FS: 12 % — AB (ref 28–44)
HEIGHTINCHES: 69 in
IV/PV OW: 0.9
LA ID, A-P, ES: 42 mm
LA diam index: 2.13 cm/m2
LA vol index: 40.8 mL/m2
LAVOL: 80.4 mL
LAVOLA4C: 79.2 mL
LEFT ATRIUM END SYS DIAM: 42 mm
LV PW d: 10 mm — AB (ref 0.6–1.1)
LV TDI E'MEDIAL: 6.09
LVELAT: 7.07 cm/s
LVOT area: 5.31 cm2
LVOT diameter: 26 mm
MV Dec: 194
MV pk E vel: 2.9 m/s
RV LATERAL S' VELOCITY: 12.3 cm/s
RV TAPSE: 21.9 mm
TDI e' lateral: 7.07
WEIGHTICAEL: 2864 [oz_av]

## 2016-03-09 LAB — BASIC METABOLIC PANEL
ANION GAP: 8 (ref 5–15)
BUN: 31 mg/dL — AB (ref 6–20)
CALCIUM: 8.6 mg/dL — AB (ref 8.9–10.3)
CO2: 27 mmol/L (ref 22–32)
Chloride: 106 mmol/L (ref 101–111)
Creatinine, Ser: 1.62 mg/dL — ABNORMAL HIGH (ref 0.61–1.24)
GFR calc Af Amer: 50 mL/min — ABNORMAL LOW (ref >60)
GFR, EST NON AFRICAN AMERICAN: 43 mL/min — AB (ref >60)
Glucose, Bld: 114 mg/dL — ABNORMAL HIGH (ref 65–99)
POTASSIUM: 2.9 mmol/L — AB (ref 3.5–5.1)
SODIUM: 141 mmol/L (ref 135–145)

## 2016-03-09 LAB — MAGNESIUM: MAGNESIUM: 1.8 mg/dL (ref 1.7–2.4)

## 2016-03-09 LAB — POTASSIUM: Potassium: 3.7 mmol/L (ref 3.5–5.1)

## 2016-03-09 MED ORDER — FUROSEMIDE 10 MG/ML IJ SOLN
40.0000 mg | Freq: Two times a day (BID) | INTRAMUSCULAR | Status: DC
  Administered 2016-03-09 – 2016-03-10 (×3): 40 mg via INTRAVENOUS
  Filled 2016-03-09 (×3): qty 4

## 2016-03-09 MED ORDER — ISOSORBIDE MONONITRATE ER 30 MG PO TB24
30.0000 mg | ORAL_TABLET | Freq: Every day | ORAL | Status: DC
  Administered 2016-03-10: 30 mg via ORAL
  Filled 2016-03-09: qty 1

## 2016-03-09 MED ORDER — POTASSIUM CHLORIDE CRYS ER 20 MEQ PO TBCR
30.0000 meq | EXTENDED_RELEASE_TABLET | Freq: Every day | ORAL | Status: DC
  Administered 2016-03-10 – 2016-03-11 (×2): 30 meq via ORAL
  Filled 2016-03-09 (×2): qty 1

## 2016-03-09 MED ORDER — SODIUM CHLORIDE 0.9 % IV SOLN
30.0000 meq | Freq: Once | INTRAVENOUS | Status: AC
  Administered 2016-03-09: 30 meq via INTRAVENOUS
  Filled 2016-03-09: qty 15

## 2016-03-09 MED ORDER — MAGNESIUM OXIDE 400 (241.3 MG) MG PO TABS
400.0000 mg | ORAL_TABLET | Freq: Two times a day (BID) | ORAL | Status: DC
  Administered 2016-03-09 – 2016-03-11 (×5): 400 mg via ORAL
  Filled 2016-03-09 (×5): qty 1

## 2016-03-09 MED ORDER — PHENOL 1.4 % MT LIQD: 1.0000 | OROMUCOSAL | Status: DC | PRN

## 2016-03-09 NOTE — Progress Notes (Signed)
PT Cancellation Note  Patient Details Name: Wesley Riley MRN: [id number] DOB: November 17, 1950   Cancelled Treatment:    Reason Eval/Treat Not Completed: Patient at procedure or test/unavailable. Pt currently having an echo. PT will continue to f/u with pt as appropriate.   Alessandra Bevels Kennidee Heyne 03/09/2016, 4:54 PM Deborah Chalk, PT, DPT [phone]

## 2016-03-09 NOTE — Care Management Note (Signed)
Case Management Note  Patient Details  Name: Wesley Riley MRN: [id number] Date of Birth: 1951/02/07  Subjective/Objective:          Admitted with CHF          Action/Plan: Patient lives at home with his spouse; PCP [name], DO ; has private insurance with Medicare; pharmacy of choice is [facility] in [place]; pt reports no problem getting his medication. He has scales at home, CM encouraged him to weigh himself daily and patient stated that he eats a diet low in sodium. No needs identified at this time. CM will continue to follow for DCP.  Expected Discharge Date: possibly 03/12/2016                 Expected Discharge Plan:  Home/Self Care  Discharge planning Services  CM Consult   Status of Service:  In process, will continue to follow  Reola Mosher [phone] 03/09/2016, 4:28 PM

## 2016-03-09 NOTE — Progress Notes (Signed)
Pt had 6 beats of questionable Vtach. Pt asymptomatic. No c/o chest pains nor SOB.continued to observe pt closely

## 2016-03-09 NOTE — Progress Notes (Signed)
  Echocardiogram 2D Echocardiogram has been performed.  Wesley Riley 03/09/2016, 5:39 PM

## 2016-03-09 NOTE — Progress Notes (Signed)
Pt had 7 beats of VT. Patient resting in bed, asymptomatic. No c/o chest pain. Paged MD to make aware. Will continue to monitor.

## 2016-03-09 NOTE — Consult Note (Signed)
CARDIOLOGY CONSULT NOTE   Patient ID: Wesley Riley MRN: [id number] DOB/AGE: 29-Jul-1950 65 y.o.  Admit date: 03/07/2016  Requesting Physician: Dr. Ronalee Belts  Primary Physician:   [name], DO Primary Cardiologist:  Dr. [name]  Reason for Consultation:   CHF  HPI: Wesley Riley is a 65 y.o. male with a history of tobacco abuse, CKD, history of NICM (with normalization of EF), HTN, NSVT who presented to [facility] on 03/07/16 with SOB.   He was admitted 05/2014 with acute systolic failure due to nonischemic cardiomyopathy probably due to uncontrolled hypertension. 2-D echo EF 35-40%. Cardiac catheterization showed no evidence of CAD. The patient also had 9 beats of nonsustained V. tach and was started on Coreg. He had acute on chronic kidney disease and discharge creatinine was 1.65. No ACE inhibitor or ARB was used.  2D ECHO in 09/2014 showed his EF had normalized with LVEF 55-60%, moderate LVH and grade 1 diastolic dysfunction.  He has been followed by Dr. [name] for difficult to control BPs. He was intolerant to amlodopine. His hydralazine has been titrated up.   The patient was in his usual state of health until a week or so ago when he started noticing worsening shortness of breath. In about January of this year he stopped taking all of his blood pressure medications because he was frustrated that they weren't helping his blood pressure at all. He's been maintained on herbal supplements and feels like his blood pressure is been moderately controlled. He denies unexplained weight gain, lower extremity edema or abdominal distention but has had some orthopnea and PND. He has "episodes" of breathlessness that concerned him enough to call EMS.   In the ER his BNP ~1082 and POC troponin 0.11. His troponin has continued to be elevated in the low and flat trend (0.33--> 0.21--> 0.19). He was started on IV Lasix 40 mg daily, Coreg 6.25 mg twice a day and aspirin 325 mg daily.   He  has been feeling better after IV lasix. 2D ECHO pending. No currently complaints. He would like to be on as few medications as possible. He continues to smoke about a pack a day of cigarettes.      Past Medical History:  Diagnosis Date  . CHF (congestive heart failure) (HCC) 03/07/2016   pt sts he was dx within last year   . Complication of anesthesia   . Dyspnea   . HTN (hypertension)   . PONV (postoperative nausea and vomiting)   . Tobacco abuse      Past Surgical History:  Procedure Laterality Date  . HYDROCELE EXCISION    . KNEE SURGERY Right 2013  . LEFT HEART CATHETERIZATION WITH CORONARY ANGIOGRAM N/A 06/21/2014   Procedure: LEFT HEART CATHETERIZATION WITH CORONARY ANGIOGRAM;  Surgeon: Kathleene Hazel, MD;  Location: [facility] CATH LAB;  Service: Cardiovascular;  Laterality: N/A;  . VASECTOMY      Allergies  Allergen Reactions  . Penicillins Hives and Rash    Has patient had a PCN reaction causing immediate rash, facial/tongue/throat swelling, SOB or lightheadedness with hypotension: Yes Has patient had a PCN reaction causing severe rash involving mucus membranes or skin necrosis: No Has patient had a PCN reaction that required hospitalization: No Has patient had a PCN reaction occurring within the last 10 years: No If all of the above answers are "NO", then may proceed with Cephalosporin use.     I have reviewed the patient's current medications . aspirin EC  325 mg Oral Daily  . carvedilol  6.25 mg Oral BID WC  . enoxaparin (LOVENOX) injection  40 mg Subcutaneous Daily  . furosemide  40 mg Intravenous Daily  . [START ON 03/10/2016] potassium chloride  30 mEq Oral Daily  . potassium chloride (KCL MULTIRUN) 30 mEq in 265 mL IVPB  30 mEq Intravenous Once    acetaminophen **OR** acetaminophen, guaiFENesin-dextromethorphan, ondansetron **OR** ondansetron (ZOFRAN) IV, phenol  Prior to Admission medications   Medication Sig Start Date End Date Taking? Authorizing  Provider  carvedilol (COREG) 6.25 MG tablet Take 6.25 mg by mouth 2 (two) times daily with a meal.   Yes Historical Provider, MD  furosemide (LASIX) 20 MG tablet Take 20 mg by mouth daily.   Yes Historical Provider, MD  NON FORMULARY Electromagnetic (all-body) beamer/stimulator: Lay on mat 8-12 minutes (per session) twice a day to stimulate vagus nerve activity   Yes Historical Provider, MD  OVER THE COUNTER MEDICATION Colloidal Silver: Take 30 ml's by mouth once a day   Yes Historical Provider, MD  OVER THE COUNTER MEDICATION Essential oils: One drop of peppermint oil applied to the tongue as needed for coughing   Yes Historical Provider, MD  carvedilol (COREG) 12.5 MG tablet Take 1 tablet (12.5 mg total) by mouth 2 (two) times daily. Patient not taking: Reported on 03/07/2016 07/08/14   Antoine Poche, MD  furosemide (LASIX) 40 MG tablet Take 1 tablet (40 mg total) by mouth daily as needed. Patient not taking: Reported on 03/07/2016 12/13/14   Laqueta Linden, MD  hydrALAZINE (APRESOLINE) 100 MG tablet Take 1 tablet (100 mg total) by mouth 3 (three) times daily. Patient not taking: Reported on 03/07/2016 03/21/15   Laqueta Linden, MD     Social History   Social History  . Marital status: Married    Spouse name: N/A  . Number of children: N/A  . Years of education: N/A   Occupational History  . Not on file.   Social History Main Topics  . Smoking status: Current Every Day Smoker    Packs/day: 1.00    Years: 46.00    Types: Cigars, Cigarettes  . Smokeless tobacco: Current User  . Alcohol use No  . Drug use: No  . Sexual activity: Not on file   Other Topics Concern  . Not on file   Social History Narrative  . No narrative on file    Family Status  Relation Status  . Mother   .     Family History  Problem Relation Age of Onset  . Stroke Mother   . Heart disease      ROS:  Full 14 point review of systems complete and found to be negative unless listed  above.  Physical Exam: Blood pressure 118/75, pulse 68, temperature 97.8 F (36.6 C), temperature source Oral, resp. rate 16, height 5\' 9"  (1.753 m), weight 179 lb (81.2 kg), SpO2 99 %.  General: Well developed, well nourished, male in no acute distress Head: Eyes PERRLA, No xanthomas.   Normocephalic and atraumatic, oropharynx without edema or exudate. Dentition:  Lungs: crackles at bases Heart: HRRR S1 S2, no rub/gallop, Heart regular rate and rhythm with S1, S2  No murmur. pulses are 2+ extrem.   Neck: No carotid bruits. No lymphadenopathy.  No JVD. Abdomen: Bowel sounds present, abdomen soft and non-tender without masses or hernias noted. Msk:  No spine or cva tenderness. No weakness, no joint deformities or effusions. Extremities: No clubbing or cyanosis.  No LE edema.  Neuro: Alert and oriented X 3. No focal deficits noted. Psych:  Good affect, responds appropriately Skin: No rashes or lesions noted.  Labs:   Lab Results  Component Value Date   WBC 6.7 03/08/2016   HGB 13.9 03/08/2016   HCT 39.9 03/08/2016   MCV 94.5 03/08/2016   PLT 181 03/08/2016   No results for input(s): INR in the last 72 hours.  Recent Labs Lab 03/07/16 2302  03/09/16 0309  NA 138  --  141  K 3.2*  --  2.9*  CL 106  --  106  CO2 25  --  27  BUN 24*  --  31*  CREATININE 1.58*  < > 1.62*  CALCIUM 8.5*  --  8.6*  PROT 6.3*  --   --   BILITOT 0.4  --   --   ALKPHOS 83  --   --   ALT 46  --   --   AST 71*  --   --   GLUCOSE 128*  --  114*  ALBUMIN 3.1*  --   --   < > = values in this interval not displayed. Magnesium  Date Value Ref Range Status  03/09/2016 1.8 1.7 - 2.4 mg/dL Final    Recent Labs  27/25/3611/16/17 0537 03/08/16 1107 03/08/16 1558  TROPONINI 0.33* 0.21* 0.19*    Recent Labs  03/07/16 2308  TROPIPOC 0.11*   No results found for: PROBNP Lab Results  Component Value Date   CHOL 176 06/20/2014   HDL 41 06/20/2014   LDLCALC 123 (H) 06/20/2014   TRIG 58 06/20/2014   No  results found for: DDIMER Lipase  Date/Time Value Ref Range Status  06/23/2014 04:01 AM 24 11 - 59 U/L Final   TSH  Date/Time Value Ref Range Status  03/08/2016 05:37 AM 1.744 0.350 - 4.500 uIU/mL Final    Comment:    Performed by a 3rd Generation assay with a functional sensitivity of <=0.01 uIU/mL.  06/20/2014 03:16 AM 2.283 0.350 - 4.500 uIU/mL Final   Vitamin B-12  Date/Time Value Ref Range Status  06/21/2014 12:10 AM 429 211 - 911 pg/mL Final    Comment:    [facility]formed [facility]   Ferritin  Date/Time Value Ref Range Status  06/21/2014 12:10 AM 262 22 - 322 ng/mL Final    Comment:    Performed at [facility]   TIBC  Date/Time Value Ref Range Status  06/21/2014 12:10 AM 201 (L) 215 - 435 ug/dL Final   Iron  Date/Time Value Ref Range Status  06/21/2014 12:10 AM 24 (L) 42 - 165 ug/dL Final   Cardiac cath done on 06/21/14 with Dr. Sanjuana KavaMcAlhaney showed no angiographic evidence of CAD   Echo: 09/30/2014 LV EF: 55% -   60% Study Conclusions - Left ventricle: The cavity size was normal. Wall thickness was   increased in a pattern of moderate LVH. Systolic function was   normal. The estimated ejection fraction was in the range of 55%   to 60%. Wall motion was normal; there were no regional wall   motion abnormalities. Doppler parameters are consistent with   abnormal left ventricular relaxation (grade 1 diastolic   dysfunction). - Aortic valve: Mildly calcified annulus. Trileaflet; mildly   thickened leaflets. There was trivial regurgitation. Valve area   (VTI): 3.89 cm^2. Valve area (Vmax): 3.66 cm^2. - Mitral valve: Mildly calcified annulus. Mildly thickened leaflets  - Left atrium: The atrium was mildly  dilated. - Atrial septum: No defect or patent foramen ovale was identified.  ECG:  Sinus rhythm LVH with secondary repolarization abnormality TWI in the inferior and lateral leads are similar to previous   Radiology:  Dg Chest 2 View  Result  Date: 03/07/2016 CLINICAL DATA:  Shortness of breath and cough.  History of CHF. EXAM: CHEST  2 VIEW COMPARISON:  Chest radiograph June 21, 2014 FINDINGS: Cardiac silhouette is moderately enlarged, similar. Mildly tortuous atherosclerotic aorta. Pulmonary vascular congestion mild interstitial prominence with LEFT lung base strandy densities. No pleural effusion or focal consolidation here lead no pneumothorax. Soft tissue planes and included osseous structure nonsuspicious. IMPRESSION: Cardiomegaly, pulmonary vascular congestion and mild interstitial prominence concerning for pulmonary edema. Bibasilar atelectasis. Electronically Signed   By: Awilda Metro M.D.   On: 03/07/2016 23:05   ASSESSMENT AND PLAN:    Active Problems:   Tobacco abuse   Essential hypertension   Acute systolic heart failure (HCC)   Acute respiratory failure with hypoxia (HCC)   CHF (congestive heart failure) (HCC)   Acute pulmonary edema (HCC)   Wesley Riley is a 65 y.o. male with a history of tobacco abuse, CKD, history of NICM (with normalization of EF), HTN, NSVT who presented to [facility] on 03/08/16 with SOB.   Acute on chronic CHF: patient's EF on 05/2014 was 35-40% which subsequently improved to 55-60% in 09/2014. Cardiac cath done in 05/2014 showed no CAD. -- Currently on IV lasix 40mg  daily. Will increase this to 40mg  BID. Net neg 1.1L. Weight down 1lbs (180-179). Restarted on Coerg 6.25mg  BID. No ACE inhibitor as or ARB due CKD. Would add back hydralazine nitrate combo or ACE/ARB when more compensated if EF back down. 2D ECHO this admission pending.   Elevated troponin: low and flat c/w demand ischemia in the setting of CHF. Cardiac cath 05/2014 with no CAD  Hypertension: BP has been surprisingly well controlled on Coreg 6.25mg  BID  CKD stage III: creat up to 1.62. Follow with diuresis  Tobacco abuse: patient advised to quit smoking.  NSVT: noted on tele. Continue Coreg.   Hypokalemia/hypomagnesium: this  is being supplemented.   Signed: Cline Crock, PA-C 03/09/2016 1:15 PM  Pager [phone]  Co-Sign MD

## 2016-03-09 NOTE — Progress Notes (Addendum)
PROGRESS NOTE    Wesley Riley  ZOX:[id number] DOB: [birth year] DOA: 03/07/2016 PCP: [name], DO   Brief Narrative: 65 y.o. male with known history of nonischemic cardiomyopathy diagnosed last year and at that time patient had undergone cardiac cath and coronary unremarkable presents to the ER because of worsening shortness of breath over the last 1 week. Patient with possible acute congestive heart failure.  Assessment & Plan:   # Possible acute diastolic congestive heart failure with pulmonary edema: BNP of 1082 on admission. Chest x-ray consistent with pulmonary edema. Last echo in 2016 consistent with breakfast diastolic dysfunction. -Patient clinically better today. Continue IV Lasix 40 mg. Strict ins and outs. Daily weight. -Echocardiogram pending. -Continue aspirin, Coreg.  #Elevated troponin level: Peaked troponin of 0.33. Likely related with congestive heart failure. Continue telemetry monitoring. Cardiology consulted today. Discussed with them.  #Nonsustained V. tach: Reportedly patient had 6 bruits of V. tach. Hypokalemia noticed and repeated with potassium chloride. Magnesium level I.8. Continue to monitor in telemetry.  #Acute respiratory failure with hypoxia (HCC) due to acute pulmonary edema: On IV diuretics. Currently on 2 L of oxygen maintaining 99% of oxygen saturation. Try to wean off oxygen gradually.  #Hypokalemia: Replete potassium chloride IV. Increase the dose of oral potassium tablet. Magnesium acceptable. Repeating potassium level.  #Chronic kidney disease stage III: Serum creatinine level around baseline. Monitor BMP.    Continue current medical and supportive care.  DVT prophylaxis: Lovenox subcutaneous Code Status: Full code Family Communication: No family present at bedside Disposition Plan: Likely discharge home in 1-2 days.  Consultants:   : Cardiology consult  Procedures: Pending echo Antimicrobials: None  Subjective: Patient was seen  and examined at bedside. Patient reported feeling much better today. Mild shortness of breath and dry cough. Denied fever, chills, chest pain, nausea or vomiting.   Objective: Vitals:   03/09/16 0016 03/09/16 0515 03/09/16 0741 03/09/16 1132  BP: 129/77 115/69 (!) 140/98 118/75  Pulse: 67 66 63 68  Resp: Temp: 98.7 F (37.1 C) 98.2 F (36.8 C) 97.5 F (36.4 C) 97.8 F (36.6 C)  TempSrc: Oral Oral Oral Oral  SpO2: 97% 96% 96% 99%  Weight:  81.2 kg (179 lb)    Height:        Intake/Output Summary (Last 24 hours) at 03/09/16 1241 Last data filed at 03/09/16 1132  Gross per 24 hour  Intake              705 ml  Output             1500 ml  Net             -795 ml   Filed Weights   03/07/16 2218 03/08/16 0604 03/09/16 0515  Weight: 81.6 kg (180 lb) 80.6 kg (177 lb 11.2 oz) 81.2 kg (179 lb)    Examination:  General exam: Appears calm and comfortable  Respiratory system: Bibasal crackle, respiratory effort normal. No wheezing. Cardiovascular system: S1 & S2 heard, RRR.  No pedal edema. Gastrointestinal system: Abdomen is nondistended, soft and nontender. Normal bowel sounds heard. Central nervous system: Alert and oriented. No focal neurological deficits. Extremities: Symmetric 5 x 5 power. Skin: No rashes, lesions or ulcers Psychiatry: Judgement and insight appear normal. Mood & affect appropriate.     Data Reviewed: I have personally reviewed following labs and imaging studies  CBC:  Recent Labs Lab 03/07/16 2302 03/08/16 0537  WBC 12.4* 6.7  NEUTROABS 10.8*  --  HGB 13.3 13.9  HCT 37.9* 39.9  MCV 95.2 94.5  PLT 179 181   Basic Metabolic Panel:  Recent Labs Lab 03/07/16 2302 03/08/16 0537 03/09/16 0309 03/09/16 0929  NA 138  --  141  --   K 3.2*  --  2.9*  --   CL 106  --  106  --   CO2 25  --  27  --   GLUCOSE 128*  --  114*  --   BUN 24*  --  31*  --   CREATININE 1.58* 1.48* 1.62*  --   CALCIUM 8.5*  --  8.6*  --   MG  --   --   --   1.8   GFR: Estimated Creatinine Clearance: 45.5 mL/min (by C-G formula based on SCr of 1.62 mg/dL (H)). Liver Function Tests:  Recent Labs Lab 03/07/16 2302  AST 71*  ALT 46  ALKPHOS 83  BILITOT 0.4  PROT 6.3*  ALBUMIN 3.1*   No results for input(s): LIPASE, AMYLASE in the last 168 hours. No results for input(s): AMMONIA in the last 168 hours. Coagulation Profile: No results for input(s): INR, PROTIME in the last 168 hours. Cardiac Enzymes:  Recent Labs Lab 03/08/16 0537 03/08/16 1107 03/08/16 1558  TROPONINI 0.33* 0.21* 0.19*   BNP (last 3 results) No results for input(s): PROBNP in the last 8760 hours. HbA1C: No results for input(s): HGBA1C in the last 72 hours. CBG: No results for input(s): GLUCAP in the last 168 hours. Lipid Profile: No results for input(s): CHOL, HDL, LDLCALC, TRIG, CHOLHDL, LDLDIRECT in the last 72 hours. Thyroid Function Tests:  Recent Labs  03/08/16 0537  TSH 1.744   Anemia Panel: No results for input(s): VITAMINB12, FOLATE, FERRITIN, TIBC, IRON, RETICCTPCT in the last 72 hours. Sepsis Labs: No results for input(s): PROCALCITON, LATICACIDVEN in the last 168 hours.  No results found for this or any previous visit (from the past 240 hour(s)).       Radiology Studies: Dg Chest 2 View  Result Date: 03/07/2016 CLINICAL DATA:  Shortness of breath and cough.  History of CHF. EXAM: CHEST  2 VIEW COMPARISON:  Chest radiograph June 21, 2014 FINDINGS: Cardiac silhouette is moderately enlarged, similar. Mildly tortuous atherosclerotic aorta. Pulmonary vascular congestion mild interstitial prominence with LEFT lung base strandy densities. No pleural effusion or focal consolidation here lead no pneumothorax. Soft tissue planes and included osseous structure nonsuspicious. IMPRESSION: Cardiomegaly, pulmonary vascular congestion and mild interstitial prominence concerning for pulmonary edema. Bibasilar atelectasis. Electronically Signed   By:  Awilda Metro M.D.   On: 03/07/2016 23:05        Scheduled Meds: . aspirin EC  325 mg Oral Daily  . carvedilol  6.25 mg Oral BID WC  . enoxaparin (LOVENOX) injection  40 mg Subcutaneous Daily  . furosemide  40 mg Intravenous Daily  . potassium chloride  10 mEq Oral Daily  . potassium chloride (KCL MULTIRUN) 30 mEq in 265 mL IVPB  30 mEq Intravenous Once   Continuous Infusions:   LOS: 1 day    Time spent: 28 minutes    Moe Graca Jaynie Collins, MD [facility] Pager [phone]  If 7PM-7AM, please contact night-coverage [email or web address] Password [email or web address] 03/09/2016, 12:41 PM

## 2016-03-10 DIAGNOSIS — I5021 Acute systolic (congestive) heart failure: Secondary | ICD-10-CM

## 2016-03-10 LAB — BASIC METABOLIC PANEL
ANION GAP: 9 (ref 5–15)
BUN: 26 mg/dL — AB (ref 6–20)
CHLORIDE: 103 mmol/L (ref 101–111)
CO2: 30 mmol/L (ref 22–32)
Calcium: 8.1 mg/dL — ABNORMAL LOW (ref 8.9–10.3)
Creatinine, Ser: 1.6 mg/dL — ABNORMAL HIGH (ref 0.61–1.24)
GFR calc Af Amer: 51 mL/min — ABNORMAL LOW (ref >60)
GFR, EST NON AFRICAN AMERICAN: 44 mL/min — AB (ref >60)
GLUCOSE: 100 mg/dL — AB (ref 65–99)
POTASSIUM: 3.2 mmol/L — AB (ref 3.5–5.1)
Sodium: 142 mmol/L (ref 135–145)

## 2016-03-10 MED ORDER — SODIUM CHLORIDE 0.9 % IV SOLN
30.0000 meq | Freq: Once | INTRAVENOUS | Status: AC
  Administered 2016-03-10: 30 meq via INTRAVENOUS
  Filled 2016-03-10: qty 15

## 2016-03-10 MED ORDER — LOSARTAN POTASSIUM 25 MG PO TABS
25.0000 mg | ORAL_TABLET | Freq: Every day | ORAL | Status: DC
  Administered 2016-03-10 – 2016-03-11 (×2): 25 mg via ORAL
  Filled 2016-03-10 (×2): qty 1

## 2016-03-10 NOTE — Evaluation (Signed)
Physical Therapy Evaluation Patient Details Name: Wesley GarfinkelStephen E Riley MRN: [id number] DOB: 07-Dec-1950 Today's Date: 03/10/2016   History of Present Illness  Pt adm with acute heart failure. PMH - HTN  Clinical Impression  Pt doing well with mobility and no further PT needed. Pt able to maintain adequate SpO2 (>95%) with activity on RA.      Follow Up Recommendations No PT follow up    Equipment Recommendations  None recommended by PT    Recommendations for Other Services       Precautions / Restrictions Precautions Precautions: None Restrictions Weight Bearing Restrictions: No      Mobility  Bed Mobility Overal bed mobility: Independent                Transfers Overall transfer level: Independent                  Ambulation/Gait Ambulation/Gait assistance: Independent Ambulation Distance (Feet): 1000 Feet Assistive device: None Gait Pattern/deviations: WFL(Within Functional Limits)   Gait velocity interpretation: at or above normal speed for age/gender General Gait Details: Steady gait. Checked SpO2. On RA pt with SpO2 96% or greater. No dyspnea.  Stairs            Wheelchair Mobility    Modified Rankin (Stroke Patients Only)       Balance Overall balance assessment: Independent                                           Pertinent Vitals/Pain Pain Assessment: No/denies pain    Home Living Family/patient expects to be discharged to:: Private residence Living Arrangements: Spouse/significant other Available Help at Discharge: Family Type of Home: House       Home Layout: One level Home Equipment: None      Prior Function Level of Independence: Independent               Hand Dominance        Extremity/Trunk Assessment   Upper Extremity Assessment: Overall WFL for tasks assessed           Lower Extremity Assessment: Overall WFL for tasks assessed         Communication   Communication: No  difficulties  Cognition Arousal/Alertness: Awake/alert Behavior During Therapy: WFL for tasks assessed/performed Overall Cognitive Status: Within Functional Limits for tasks assessed                      General Comments      Exercises     Assessment/Plan    PT Assessment Patent does not need any further PT services  PT Problem List            PT Treatment Interventions      PT Goals (Current goals can be found in the Care Plan section)  Acute Rehab PT Goals PT Goal Formulation: All assessment and education complete, DC therapy    Frequency     Barriers to discharge        Co-evaluation               End of Session   Activity Tolerance: Patient tolerated treatment well Patient left: Other (comment) (standing in room) Nurse Communication: Mobility status;Other (comment) (SpO2)         Time: [id number] PT Time Calculation (min) (ACUTE ONLY): 13 min   Charges:   PT Evaluation $PT Eval  Low Complexity: 1 Procedure     PT G CodesAngelina Ok [facility] 17-Mar-2016, 10:25 AM Skip Mayer PT [phone]

## 2016-03-10 NOTE — Progress Notes (Signed)
PROGRESS NOTE    Wesley Riley  DVV:[id number] DOB: 08-28-1950 DOA: 03/07/2016 PCP: [name], DO   Brief Narrative: 65 y.o. male with known history of nonischemic cardiomyopathy diagnosed last year and at that time patient had undergone cardiac cath and coronary unremarkable presents to the ER because of worsening shortness of breath over the last 1 week. Patient with acute congestive heart failure.  Assessment & Plan:   # Acute systolic congestive heart failure with pulmonary edema: BNP of 1082 on admission. Chest x-ray consistent with pulmonary edema. Patient was not taking medication at home.  -echo showed depressed LVEF of 30-35 % -Patient clinically better today. Continue IV Lasix 40 mg bid. Strict ins and outs. Daily weight. May be able to switch to oral Lasix tomorrow. -Continue aspirin, Coreg. -added cozaar 25 mg today  #Elevated troponin level: Peaked troponin of 0.33. Likely related with congestive heart failure. Continue telemetry monitoring. Cardiology consult appreciated.   #Nonsustained V. tach: Continue in telemeter. Replete electrolytes.  #Acute respiratory failure with hypoxia (HCC) due to acute pulmonary edema: On IV diuretics. Currently on 2 L of oxygen maintaining 99% of oxygen saturation. Try to wean off oxygen gradually.  #Hypokalemia: Replete in both oral and IV potassium. Monitor labs.   #Chronic kidney disease stage III: Serum creatinine level around baseline. Monitor BMP.    Continue current medical and supportive care.  DVT prophylaxis: Lovenox subcutaneous Code Status: Full code Family Communication: No family present at bedside Disposition Plan: Likely discharge home in 1-2 days.  Consultants:   : Cardiology consult  Procedures: Echocardiogram Antimicrobials: None  Subjective: Patient was seen and examined at bedside. He still has mild shortness of breath and dry cough. Denied headache, dizziness, chest pain, fever or chills. No nausea or  vomiting. Has increased urination with IV Lasix.   Objective: Vitals:   03/09/16 0741 03/09/16 1132 03/09/16 2137 03/10/16 0637  BP: (!) 140/98 118/75 134/87 130/83  Pulse: 63 68 68 61  Resp: 17 16 18 18   Temp: 97.5 F (36.4 C) 97.8 F (36.6 C) 98.2 F (36.8 C) 97.7 F (36.5 C)  TempSrc: Oral Oral Oral Oral  SpO2: 96% 99% 99% 95%  Weight:    81 kg (178 lb 9.6 oz)  Height:        Intake/Output Summary (Last 24 hours) at 03/10/16 1205 Last data filed at 03/10/16 0947  Gross per 24 hour  Intake              840 ml  Output             2065 ml  Net            -1225 ml   Filed Weights   03/08/16 0604 03/09/16 0515 03/10/16 0637  Weight: 80.6 kg (177 lb 11.2 oz) 81.2 kg (179 lb) 81 kg (178 lb 9.6 oz)    Examination:  General exam: Lying on bed, not in distress. Respiratory system: Clear bilateral, no wheezing or crackle. Cardiovascular system: Regular rate rhythm, S1 and S2 normal. No pedal edema.. Gastrointestinal system: Abdomen is nondistended, soft and nontender. Normal bowel sounds heard. Central nervous system: Alert and oriented. No focal neurological deficits. Extremities: Symmetric 5 x 5 power. Skin: No rashes, lesions or ulcers Psychiatry: Judgement and insight appear normal. Mood & affect appropriate.     Data Reviewed: I have personally reviewed following labs and imaging studies  CBC:  Recent Labs Lab 03/07/16 2302 03/08/16 0537  WBC 12.4* 6.7  NEUTROABS 10.8*  --  HGB 13.3 13.9  HCT 37.9* 39.9  MCV 95.2 94.5  PLT 179 181   Basic Metabolic Panel:  Recent Labs Lab 03/07/16 2302 03/08/16 0537 03/09/16 0309 03/09/16 0929 03/09/16 1545 03/10/16 0408  NA 138  --  141  --   --  142  K 3.2*  --  2.9*  --  3.7 3.2*  CL 106  --  106  --   --  103  CO2 25  --  27  --   --  30  GLUCOSE 128*  --  114*  --   --  100*  BUN 24*  --  31*  --   --  26*  CREATININE 1.58* 1.48* 1.62*  --   --  1.60*  CALCIUM 8.5*  --  8.6*  --   --  8.1*  MG  --   --    --  1.8  --   --    GFR: Estimated Creatinine Clearance: 46 mL/min (by C-G formula based on SCr of 1.6 mg/dL (H)). Liver Function Tests:  Recent Labs Lab 03/07/16 2302  AST 71*  ALT 46  ALKPHOS 83  BILITOT 0.4  PROT 6.3*  ALBUMIN 3.1*   No results for input(s): LIPASE, AMYLASE in the last 168 hours. No results for input(s): AMMONIA in the last 168 hours. Coagulation Profile: No results for input(s): INR, PROTIME in the last 168 hours. Cardiac Enzymes:  Recent Labs Lab 03/08/16 0537 03/08/16 1107 03/08/16 1558  TROPONINI 0.33* 0.21* 0.19*   BNP (last 3 results) No results for input(s): PROBNP in the last 8760 hours. HbA1C: No results for input(s): HGBA1C in the last 72 hours. CBG: No results for input(s): GLUCAP in the last 168 hours. Lipid Profile: No results for input(s): CHOL, HDL, LDLCALC, TRIG, CHOLHDL, LDLDIRECT in the last 72 hours. Thyroid Function Tests:  Recent Labs  03/08/16 0537  TSH 1.744   Anemia Panel: No results for input(s): VITAMINB12, FOLATE, FERRITIN, TIBC, IRON, RETICCTPCT in the last 72 hours. Sepsis Labs: No results for input(s): PROCALCITON, LATICACIDVEN in the last 168 hours.  No results found for this or any previous visit (from the past 240 hour(s)).       Radiology Studies: No results found.      Scheduled Meds: . aspirin EC  325 mg Oral Daily  . carvedilol  6.25 mg Oral BID WC  . enoxaparin (LOVENOX) injection  40 mg Subcutaneous Daily  . furosemide  40 mg Intravenous BID  . losartan  25 mg Oral Daily  . magnesium oxide  400 mg Oral BID  . potassium chloride  30 mEq Oral Daily  . potassium chloride (KCL MULTIRUN) 30 mEq in 265 mL IVPB  30 mEq Intravenous Once   Continuous Infusions:   LOS: 2 days    Time spent: 25 minutes    Dron Jaynie CollinsPrasad Bhandari, MD [facility] Pager [phone]  If 7PM-7AM, please contact night-coverage [email or web address] Password [email or web address] 03/10/2016, 12:05 PM

## 2016-03-10 NOTE — Progress Notes (Signed)
Patient Name: Wesley Riley Date of Encounter: 03/10/2016  Primary Cardiologist: Dr [facility] Problem List     Active Problems:   Tobacco abuse   Essential hypertension   Acute systolic heart failure (HCC)   Acute respiratory failure with hypoxia (HCC)   CHF (congestive heart failure) (HCC)   Acute pulmonary edema (HCC)     Subjective   Dyspnea improving; no chest pain; continues with cough  Inpatient Medications    Scheduled Meds: . aspirin EC  325 mg Oral Daily  . carvedilol  6.25 mg Oral BID WC  . enoxaparin (LOVENOX) injection  40 mg Subcutaneous Daily  . furosemide  40 mg Intravenous BID  . isosorbide mononitrate  30 mg Oral Daily  . magnesium oxide  400 mg Oral BID  . potassium chloride  30 mEq Oral Daily  . potassium chloride (KCL MULTIRUN) 30 mEq in 265 mL IVPB  30 mEq Intravenous Once   Continuous Infusions:  PRN Meds: acetaminophen **OR** acetaminophen, guaiFENesin-dextromethorphan, ondansetron **OR** ondansetron (ZOFRAN) IV, phenol   Vital Signs    Vitals:   03/09/16 0741 03/09/16 1132 03/09/16 2137 03/10/16 0637  BP: (!) 140/98 118/75 134/87 130/83  Pulse: 63 68 68 61  Resp: 17 16 18 18   Temp: 97.5 F (36.4 C) 97.8 F (36.6 C) 98.2 F (36.8 C) 97.7 F (36.5 C)  TempSrc: Oral Oral Oral Oral  SpO2: 96% 99% 99% 95%  Weight:    178 lb 9.6 oz (81 kg)  Height:        Intake/Output Summary (Last 24 hours) at 03/10/16 1002 Last data filed at 03/10/16 0947  Gross per 24 hour  Intake              840 ml  Output             2390 ml  Net            -1550 ml   Filed Weights   03/08/16 0604 03/09/16 0515 03/10/16 0637  Weight: 177 lb 11.2 oz (80.6 kg) 179 lb (81.2 kg) 178 lb 9.6 oz (81 kg)    Physical Exam   GEN: Well nourished, well developed, in no acute distress.  HEENT: Grossly normal.  Neck: Supple Cardiac: RRR Respiratory:  Minimal basilar crackles GI: Soft, nontender, nondistended MS: no deformity or atrophy. Skin: warm  and dry, no rash. Neuro:  Strength and sensation are intact. Psych: AAOx3.  Normal affect.  Labs    CBC  Recent Labs  03/07/16 2302 03/08/16 0537  WBC 12.4* 6.7  NEUTROABS 10.8*  --   HGB 13.3 13.9  HCT 37.9* 39.9  MCV 95.2 94.5  PLT 179 181   Basic Metabolic Panel  Recent Labs  03/09/16 0309 03/09/16 0929 03/09/16 1545 03/10/16 0408  NA 141  --   --  142  K 2.9*  --  3.7 3.2*  CL 106  --   --  103  CO2 27  --   --  30  GLUCOSE 114*  --   --  100*  BUN 31*  --   --  26*  CREATININE 1.62*  --   --  1.60*  CALCIUM 8.6*  --   --  8.1*  MG  --  1.8  --   --    Liver Function Tests  Recent Labs  03/07/16 2302  AST 71*  ALT 46  ALKPHOS 83  BILITOT 0.4  PROT 6.3*  ALBUMIN 3.1*  Cardiac Enzymes  Recent Labs  03/08/16 0537 03/08/16 1107 03/08/16 1558  TROPONINI 0.33* 0.21* 0.19*     Recent Labs  03/08/16 0537  TSH 1.744     Patient Profile     65 year old male with history of nonischemic cardiomyopathy (previous catheterization normal); LV function improved; discontinued his medications in January this year. Presented with acute systolic congestive heart failure symptoms. Follow-up echocardiogram shows ejection fraction 30-35%.   Assessment & Plan    1 acute systolic congestive heart failure-volume status is improving. Continue present dose of Lasix and follow renal function. Can likely transition to oral Lasix tomorrow.  2 nonischemic cardiomyopathy-LV function is now reduced again after patient discontinued his medications. Continue carvedilol. I will add low-dose Cozaar 25 mg daily and follow renal function closely. Medications can be titrated as an outpatient.  3 chronic stage III kidney disease-follow renal function closely with above medication changes.  4 hypertension-blood pressure controlled. Continue present medications.   5 elevated troponin-no clear trend and not consistent with acute coronary syndrome. Previous catheterization  showed no coronary disease.    Signed, Olga MillersBrian Crenshaw, MD  03/10/2016, 10:02 AM

## 2016-03-11 ENCOUNTER — Emergency Department (HOSPITAL_COMMUNITY): Payer: Medicare Other

## 2016-03-11 ENCOUNTER — Inpatient Hospital Stay (HOSPITAL_COMMUNITY)
Admission: EM | Admit: 2016-03-11 | Discharge: 2016-03-14 | DRG: 291 | Disposition: A | Discharge status: Home / Self Care | Payer: Medicare Other | Attending: Internal Medicine | Admitting: Internal Medicine

## 2016-03-11 ENCOUNTER — Encounter (HOSPITAL_COMMUNITY): Payer: Self-pay | Admitting: *Deleted

## 2016-03-11 DIAGNOSIS — J810 Acute pulmonary edema: Secondary | ICD-10-CM | POA: Diagnosis present

## 2016-03-11 DIAGNOSIS — I509 Heart failure, unspecified: Secondary | ICD-10-CM

## 2016-03-11 DIAGNOSIS — J9600 Acute respiratory failure, unspecified whether with hypoxia or hypercapnia: Secondary | ICD-10-CM | POA: Insufficient documentation

## 2016-03-11 DIAGNOSIS — I130 Hypertensive heart and chronic kidney disease with heart failure and stage 1 through stage 4 chronic kidney disease, or unspecified chronic kidney disease: Principal | ICD-10-CM | POA: Diagnosis present

## 2016-03-11 DIAGNOSIS — F17210 Nicotine dependence, cigarettes, uncomplicated: Secondary | ICD-10-CM | POA: Diagnosis present

## 2016-03-11 DIAGNOSIS — Z880 Allergy status to penicillin: Secondary | ICD-10-CM

## 2016-03-11 DIAGNOSIS — J9601 Acute respiratory failure with hypoxia: Secondary | ICD-10-CM

## 2016-03-11 DIAGNOSIS — Z79899 Other long term (current) drug therapy: Secondary | ICD-10-CM

## 2016-03-11 DIAGNOSIS — R05 Cough: Secondary | ICD-10-CM

## 2016-03-11 DIAGNOSIS — I428 Other cardiomyopathies: Secondary | ICD-10-CM | POA: Diagnosis present

## 2016-03-11 DIAGNOSIS — R7989 Other specified abnormal findings of blood chemistry: Secondary | ICD-10-CM

## 2016-03-11 DIAGNOSIS — Z7982 Long term (current) use of aspirin: Secondary | ICD-10-CM

## 2016-03-11 DIAGNOSIS — I5043 Acute on chronic combined systolic (congestive) and diastolic (congestive) heart failure: Secondary | ICD-10-CM

## 2016-03-11 DIAGNOSIS — J811 Chronic pulmonary edema: Secondary | ICD-10-CM

## 2016-03-11 DIAGNOSIS — N183 Chronic kidney disease, stage 3 unspecified: Secondary | ICD-10-CM | POA: Diagnosis present

## 2016-03-11 DIAGNOSIS — I248 Other forms of acute ischemic heart disease: Secondary | ICD-10-CM | POA: Diagnosis present

## 2016-03-11 DIAGNOSIS — F17290 Nicotine dependence, other tobacco product, uncomplicated: Secondary | ICD-10-CM | POA: Diagnosis present

## 2016-03-11 DIAGNOSIS — R778 Other specified abnormalities of plasma proteins: Secondary | ICD-10-CM | POA: Diagnosis present

## 2016-03-11 DIAGNOSIS — E876 Hypokalemia: Secondary | ICD-10-CM | POA: Diagnosis not present

## 2016-03-11 DIAGNOSIS — Z9114 Patient's other noncompliance with medication regimen: Secondary | ICD-10-CM

## 2016-03-11 DIAGNOSIS — Z720 Tobacco use: Secondary | ICD-10-CM | POA: Diagnosis present

## 2016-03-11 DIAGNOSIS — R059 Cough, unspecified: Secondary | ICD-10-CM

## 2016-03-11 DIAGNOSIS — I472 Ventricular tachycardia: Secondary | ICD-10-CM | POA: Diagnosis present

## 2016-03-11 HISTORY — DX: Chronic kidney disease, stage 3 (moderate): N18.3

## 2016-03-11 HISTORY — DX: Other specified postprocedural states: Z98.890

## 2016-03-11 HISTORY — DX: Chronic kidney disease, stage 3 unspecified: N18.30

## 2016-03-11 HISTORY — DX: Other cardiomyopathies: I42.8

## 2016-03-11 HISTORY — DX: Other ventricular tachycardia: I47.29

## 2016-03-11 HISTORY — DX: Ventricular tachycardia: I47.2

## 2016-03-11 HISTORY — DX: Essential (primary) hypertension: I10

## 2016-03-11 LAB — BASIC METABOLIC PANEL
ANION GAP: 9 (ref 5–15)
BUN: 24 mg/dL — ABNORMAL HIGH (ref 6–20)
CALCIUM: 8.3 mg/dL — AB (ref 8.9–10.3)
CO2: 27 mmol/L (ref 22–32)
CREATININE: 1.59 mg/dL — AB (ref 0.61–1.24)
Chloride: 104 mmol/L (ref 101–111)
GFR, EST AFRICAN AMERICAN: 51 mL/min — AB (ref >60)
GFR, EST NON AFRICAN AMERICAN: 44 mL/min — AB (ref >60)
Glucose, Bld: 101 mg/dL — ABNORMAL HIGH (ref 65–99)
Potassium: 3.1 mmol/L — ABNORMAL LOW (ref 3.5–5.1)
Sodium: 140 mmol/L (ref 135–145)

## 2016-03-11 LAB — COMPREHENSIVE METABOLIC PANEL
ALBUMIN: 3.4 g/dL — AB (ref 3.5–5.0)
ALT: 52 U/L (ref 17–63)
ANION GAP: 9 (ref 5–15)
AST: 71 U/L — AB (ref 15–41)
Alkaline Phosphatase: 111 U/L (ref 38–126)
BUN: 32 mg/dL — AB (ref 6–20)
CO2: 25 mmol/L (ref 22–32)
Calcium: 8.6 mg/dL — ABNORMAL LOW (ref 8.9–10.3)
Chloride: 106 mmol/L (ref 101–111)
Creatinine, Ser: 1.66 mg/dL — ABNORMAL HIGH (ref 0.61–1.24)
GFR calc Af Amer: 48 mL/min — ABNORMAL LOW (ref >60)
GFR calc non Af Amer: 42 mL/min — ABNORMAL LOW (ref >60)
GLUCOSE: 306 mg/dL — AB (ref 65–99)
POTASSIUM: 4.8 mmol/L (ref 3.5–5.1)
SODIUM: 140 mmol/L (ref 135–145)
Total Bilirubin: 0.3 mg/dL (ref 0.3–1.2)
Total Protein: 6.7 g/dL (ref 6.5–8.1)

## 2016-03-11 LAB — CBC WITH DIFFERENTIAL/PLATELET
BASOS ABS: 0.1 10*3/uL (ref 0.0–0.1)
BASOS PCT: 1 %
EOS ABS: 0.3 10*3/uL (ref 0.0–0.7)
Eosinophils Relative: 3 %
HEMATOCRIT: 43.6 % (ref 39.0–52.0)
HEMOGLOBIN: 14.7 g/dL (ref 13.0–17.0)
Lymphocytes Relative: 46 %
Lymphs Abs: 4.7 10*3/uL — ABNORMAL HIGH (ref 0.7–4.0)
MCH: 33.3 pg (ref 26.0–34.0)
MCHC: 33.7 g/dL (ref 30.0–36.0)
MCV: 98.9 fL (ref 78.0–100.0)
MONO ABS: 0.8 10*3/uL (ref 0.1–1.0)
MONOS PCT: 7 %
NEUTROS ABS: 4.4 10*3/uL (ref 1.7–7.7)
NEUTROS PCT: 43 %
Platelets: 220 10*3/uL (ref 150–400)
RBC: 4.41 MIL/uL (ref 4.22–5.81)
RDW: 13.5 % (ref 11.5–15.5)
WBC: 10.2 10*3/uL (ref 4.0–10.5)

## 2016-03-11 LAB — I-STAT CG4 LACTIC ACID, ED: LACTIC ACID, VENOUS: 3.61 mmol/L — AB (ref 0.5–1.9)

## 2016-03-11 LAB — TROPONIN I: Troponin I: 0.08 ng/mL (ref <0.03)

## 2016-03-11 LAB — BRAIN NATRIURETIC PEPTIDE: B Natriuretic Peptide: 1499 pg/mL — ABNORMAL HIGH (ref 0.0–100.0)

## 2016-03-11 MED ORDER — ASPIRIN 325 MG PO TBEC: 325.0000 mg | DELAYED_RELEASE_TABLET | Freq: Every day | ORAL | 0 refills | Status: DC

## 2016-03-11 MED ORDER — POTASSIUM CHLORIDE CRYS ER 15 MEQ PO TBCR: 30.0000 meq | EXTENDED_RELEASE_TABLET | Freq: Every day | ORAL | 0 refills | Status: DC

## 2016-03-11 MED ORDER — FUROSEMIDE 40 MG PO TABS
40.0000 mg | ORAL_TABLET | Freq: Two times a day (BID) | ORAL | Status: DC
  Administered 2016-03-11: 40 mg via ORAL
  Filled 2016-03-11: qty 1

## 2016-03-11 MED ORDER — FUROSEMIDE 10 MG/ML IJ SOLN
60.0000 mg | Freq: Once | INTRAMUSCULAR | Status: AC
  Administered 2016-03-11: 60 mg via INTRAVENOUS

## 2016-03-11 MED ORDER — NITROGLYCERIN 0.4 MG SL SUBL
0.4000 mg | SUBLINGUAL_TABLET | Freq: Once | SUBLINGUAL | Status: AC
  Administered 2016-03-11: 0.4 mg via SUBLINGUAL
  Filled 2016-03-11: qty 1

## 2016-03-11 MED ORDER — FUROSEMIDE 10 MG/ML IJ SOLN
INTRAMUSCULAR | Status: AC
  Filled 2016-03-11: qty 8

## 2016-03-11 MED ORDER — LOSARTAN POTASSIUM 25 MG PO TABS: 25.0000 mg | ORAL_TABLET | Freq: Every day | ORAL | 0 refills | Status: DC

## 2016-03-11 MED ORDER — NITROGLYCERIN IN D5W 200-5 MCG/ML-% IV SOLN
5.0000 ug/min | Freq: Once | INTRAVENOUS | Status: AC
  Administered 2016-03-11: 40 ug/min via INTRAVENOUS
  Filled 2016-03-11: qty 250

## 2016-03-11 MED ORDER — FUROSEMIDE 40 MG PO TABS: 40.0000 mg | ORAL_TABLET | Freq: Two times a day (BID) | ORAL | 0 refills | Status: DC

## 2016-03-11 MED ORDER — POTASSIUM CHLORIDE CRYS ER 20 MEQ PO TBCR
40.0000 meq | EXTENDED_RELEASE_TABLET | Freq: Once | ORAL | Status: AC
  Administered 2016-03-11: 40 meq via ORAL
  Filled 2016-03-11: qty 2

## 2016-03-11 NOTE — ED Notes (Signed)
CRITICAL VALUE ALERT  Critical value received:  Troponin 0.08 mg/dl  Date of notification:  03/11/16  Time of notification:  2230  Critical value read back:Yes.    Nurse who received alert:  Y. Jermisha Hoffart, RN  Responding MD:  Dr. Patria Mane  Time MD responded:  2232 hrs

## 2016-03-11 NOTE — H&P (Signed)
TRH H&P    Patient Demographics:    Claretha CooperStephen Baskin, is a 65 y.o. male  MRN: [id number]  DOB - 03-25-1951  Admit Date - 03/11/2016  Referring MD/NP/PA: Dr. Patria Maneampos  Outpatient Primary MD for the patient is Samuel JesterYNTHIA BUTLER, DO  Patient coming from: Home  Chief Complaint  Patient presents with  . Respiratory Distress      HPI:    Claretha CooperStephen Nettleton  is a 65 y.o. male, With history of nonischemic cardiopathy, chronic systolic CHF EF 30-35%, grade 2 diastolic dysfunction, [facility]. Patient says he went home he was initially fine but started having shortness of breath around 8 PM. Phonating new complaint was cough when he went home. He denies chest pain, nausea or vomiting. When shortness of breath became worse he came to the ED. In the ED initially he required BiPAP, started nitroglycerin drip, given IV Lasix 60 mg 1.  Patient is now off BiPAP, currently on 6 L oxygen via nasal cannula. Breathing is better.    Review of systems:    In addition to the HPI above,  No Fever-chills, No Headache, No changes with Vision or hearing, No problems swallowing food or Liquids, No Abdominal pain, No Nausea or Vomiting, bowel movements are regular, No Blood in stool or Urine, No dysuria, No new skin rashes or bruises, No new joints pains-aches,  No new weakness, tingling, numbness in any extremity, No recent weight gain or loss, No polyuria, polydypsia or polyphagia, No significant Mental Stressors.  A full 10 point Review of Systems was done, except as stated above, all other Review of Systems were negative.   With Past History of the following :    Past Medical History:  Diagnosis Date  . CHF (congestive heart failure) (HCC) 03/07/2016   pt sts he was dx within last year   . Complication of anesthesia   . Dyspnea   . HTN (hypertension)   . PONV (postoperative nausea and  vomiting)   . Tobacco abuse       Past Surgical History:  Procedure Laterality Date  . HYDROCELE EXCISION    . KNEE SURGERY Right 2013  . LEFT HEART CATHETERIZATION WITH CORONARY ANGIOGRAM N/A 06/21/2014   Procedure: LEFT HEART CATHETERIZATION WITH CORONARY ANGIOGRAM;  Surgeon: Kathleene Hazelhristopher D McAlhany, MD;  [facility];  Service: Cardiovascular;  Laterality: N/A;  . VASECTOMY        Social History:      Social History  Substance Use Topics  . Smoking status: Current Every Day Smoker    Packs/day: 1.00    Years: 46.00    Types: Cigars, Cigarettes  . Smokeless tobacco: Current User  . Alcohol use No       Family History :     Family History  Problem Relation Age of Onset  . Stroke Mother   . Heart disease        Home Medications:   Prior to Admission medications   Medication Sig Start Date End Date Taking? Authorizing Provider  carvedilol (COREG) 6.25 MG tablet Take 6.25 mg by mouth 2 (two) times daily with a meal.   Yes Historical Provider, MD  furosemide (LASIX) 40 MG tablet Take 1 tablet (40 mg total) by mouth 2 (two) times daily. 03/11/16  Yes Dron Jaynie Collins, MD  losartan (COZAAR) 25 MG tablet Take 1 tablet (25 mg total) by mouth daily. 03/12/16  Yes Dron Jaynie Collins, MD  potassium chloride (KLOR-CON M15) 15 MEQ tablet Take 2 tablets (30 mEq total) by mouth daily. 03/12/16  Yes Dron Jaynie Collins, MD  aspirin EC 325 MG EC tablet Take 1 tablet (325 mg total) by mouth daily. 03/12/16   Dron Jaynie Collins, MD  NON FORMULARY Electromagnetic (all-body) beamer/stimulator: Lay on mat 8-12 minutes (per session) twice a day to stimulate vagus nerve activity    Historical Provider, MD  OVER THE COUNTER MEDICATION Colloidal Silver: Take 30 ml's by mouth once a day    Historical Provider, MD  OVER THE COUNTER MEDICATION Essential oils: One drop of peppermint oil applied to the tongue as needed for coughing    Historical Provider, MD     Allergies:      Allergies  Allergen Reactions  . Penicillins Hives and Rash    Has patient had a PCN reaction causing immediate rash, facial/tongue/throat swelling, SOB or lightheadedness with hypotension: Yes Has patient had a PCN reaction causing severe rash involving mucus membranes or skin necrosis: No Has patient had a PCN reaction that required hospitalization: No Has patient had a PCN reaction occurring within the last 10 years: No If all of the above answers are "NO", then may proceed with Cephalosporin use.      Physical Exam:   Vitals  Blood pressure 134/88, pulse 93, resp. rate (!) 27, SpO2 91 %.  1.  General: Appears in no acute distress  2. Psychiatric:  Intact judgement and  insight, awake alert, oriented x 3.  3. Neurologic: No focal neurological deficits, all cranial nerves intact.Strength 5/5 all 4 extremities, sensation intact all 4 extremities, plantars down going.  4. Eyes :  anicteric sclerae, moist conjunctivae with no lid lag. PERRLA.  5. ENMT:  Oropharynx clear with moist mucous membranes and good dentition  6. Neck:  supple, no cervical lymphadenopathy appriciated, No thyromegaly  7. Respiratory : Normal respiratory effort, good air movement bilaterally, bibasilar crackles auscultated  8. Cardiovascular : RRR, no gallops, rubs or murmurs, no leg edema  9. Gastrointestinal:  Positive bowel sounds, abdomen soft, non-tender to palpation,no hepatosplenomegaly, no rigidity or guarding       10. Skin:  No cyanosis, normal texture and turgor, no rash, lesions or ulcers  11.Musculoskeletal:  Good muscle tone,  joints appear normal , no effusions,  normal range of motion    Data Review:    CBC  Recent Labs Lab 03/07/16 2302 03/08/16 0537 03/11/16 2142  WBC 12.4* 6.7 10.2  HGB 13.3 13.9 14.7  HCT 37.9* 39.9 43.6  PLT 179 181 220  MCV 95.2 94.5 98.9  MCH 33.4 32.9 33.3  MCHC 35.1 34.8 33.7  RDW 13.6 13.6 13.5  LYMPHSABS 1.0  --  4.7*  MONOABS 0.5   --  0.8  EOSABS 0.1  --  0.3  BASOSABS 0.0  --  0.1   ------------------------------------------------------------------------------------------------------------------  Chemistries   Recent Labs Lab 03/07/16 2302 03/08/16 0537 03/09/16 0309 03/09/16 0929 03/09/16 1545 03/10/16 0408 03/11/16 0238 03/11/16 2142  NA 138  --  141  --   --  142 140 140  K 3.2*  --  2.9*  --  3.7 3.2* 3.1* 4.8  CL 106  --  106  --   --  103 104 106  CO2 25  --  27  --   --  30 27 25   GLUCOSE 128*  --  114*  --   --  100* 101* 306*  BUN 24*  --  31*  --   --  26* 24* 32*  CREATININE 1.58* 1.48* 1.62*  --   --  1.60* 1.59* 1.66*  CALCIUM 8.5*  --  8.6*  --   --  8.1* 8.3* 8.6*  MG  --   --   --  1.8  --   --   --   --   AST 71*  --   --   --   --   --   --  71*  ALT 46  --   --   --   --   --   --  52  ALKPHOS 83  --   --   --   --   --   --  111  BILITOT 0.4  --   --   --   --   --   --  0.3   ------------------------------------------------------------------------------------------------------------------  ------------------------------------------------------------------------------------------------------------------ GFR: Estimated Creatinine Clearance: 44.4 mL/min (by C-G formula based on SCr of 1.66 mg/dL (H)). Liver Function Tests:  Recent Labs Lab 03/07/16 2302 03/11/16 2142  AST 71* 71*  ALT 46 52  ALKPHOS 83 111  BILITOT 0.4 0.3  PROT 6.3* 6.7  ALBUMIN 3.1* 3.4*   No results for input(s): LIPASE, AMYLASE in the last 168 hours. No results for input(s): AMMONIA in the last 168 hours. Coagulation Profile: No results for input(s): INR, PROTIME in the last 168 hours. Cardiac Enzymes:  Recent Labs Lab 03/08/16 0537 03/08/16 1107 03/08/16 1558 03/11/16 2142  TROPONINI 0.33* 0.21* 0.19* 0.08*    --------------------------------------------------------------------------------------------------------------- Urine analysis: No results found for: COLORURINE, APPEARANCEUR,  LABSPEC, PHURINE, GLUCOSEU, HGBUR, BILIRUBINUR, KETONESUR, PROTEINUR, UROBILINOGEN, NITRITE, LEUKOCYTESUR    Imaging Results:    Dg Chest Portable 1 View  Result Date: 03/11/2016 CLINICAL DATA:  Shortness of breath and diaphoresis. History of congestive heart failure, hypertension, and smoker. EXAM: PORTABLE CHEST 1 VIEW COMPARISON:  03/07/2016 FINDINGS: Cardiac enlargement with pulmonary vascular congestion. Bilateral interstitial and perihilar alveolar infiltration. Infiltrates are progressing since previous study and likely indicating edema. Bilateral pneumonia could also have this appearance. Probable small left pleural effusion. No pneumothorax. Calcification and torsion of the aorta. IMPRESSION: Progressing changes of congestive heart failure since previous study with developing interstitial and alveolar edema. Small left pleural effusion. Electronically Signed   By: Burman Nieves M.D.   On: 03/11/2016 21:58    My personal review of EKG: Rhythm sinus tachycardia   Assessment & Plan:    Active Problems:   Acute respiratory failure (HCC)   Pulmonary edema   1. Acute CHF exacerbation- both systolic and diastolic dysfunction, given IV Lasix in the ED. BNP is 1499, We'll start Lasix 20 mg IV every 12 hours from tomorrow morning. Continue nitroglycerin glycerin drip. Lactic acid is elevated likely due to hypoxia, will repeat lactic acid. 2. Mild elevation of troponin- mild elevation of troponin, 0.08 likely from demand ischemia from pulmonary edema. We'll cycle troponin every 6 hours 3 in the hospital. 3. Chronic kidney disease stage 3- creatinine is at baseline, 1.66.Follow BMP in am.   DVT Prophylaxis-  Lovenox   AM Labs Ordered, also please review Full Orders  Family Communication: Admission, patients condition and plan of care including tests being ordered have been discussed with the patient and  his wife at bedside who indicate understanding and agree with the plan and Code  Status.  Code Status: Full code  Admission status: Observation    Time spent in minutes : 60 minutes   Babette Stum S M.D on 03/11/2016 at 11:37 PM  Between 7am to 7pm - Pager - [phone]. After 7pm go to [email or web address] - password [facility]  [facility] - Office  [phone]

## 2016-03-11 NOTE — ED Provider Notes (Signed)
[facility]-EMERGENCY DEPT Provider Note   CSN: [id number] Arrival date & time: 03/11/16  2139  By signing my name below, I, Clarisse Gouge, attest that this documentation has been prepared under the direction and in the presence of Azalia Bilis, MD. Electronically signed, Clarisse Gouge, ED Scribe. 03/11/16. 9:58 PM.    History   Chief Complaint Chief Complaint  Patient presents with  . Respiratory Distress   The history is provided by the EMS personnel and the patient. The history is limited by the condition of the patient. No language interpreter was used.   HPI Comments: Wesley Riley is a 65 y.o. male with Hx of HTN, brought in by EMS who presents to the Emergency Department for an episode of acute respiratory distress. Face mask on arrival. EMS Attempted CPAP on transit, but pt did not tolerate it well. Per EMS, pt exhibits associated diaphoresis on transit, JVD and tachycardia. He is a non-smoker on lasics. 3 tablets of nitroglycerine administered 5 minutes apart. Pt was released from [facility] today ~12:00PM. No activity at onset of symptoms. EMS denies edema.   Past Medical History:  Diagnosis Date  . CHF (congestive heart failure) (HCC) 03/07/2016   pt sts he was dx within last year   . Complication of anesthesia   . Dyspnea   . HTN (hypertension)   . PONV (postoperative nausea and vomiting)   . Tobacco abuse     Patient Active Problem List   Diagnosis Date Noted  . Acute exacerbation of CHF (congestive heart failure) (HCC) 03/08/2016  . Acute respiratory failure with hypoxia (HCC) 03/08/2016  . CHF (congestive heart failure) (HCC) 03/08/2016  . Acute pulmonary edema (HCC)   . Nonischemic cardiomyopathy (HCC) 06/30/2014  . Acute renal failure superimposed on stage 3 chronic kidney disease (HCC) 06/24/2014  . Dyspnea   . Nausea & vomiting 06/22/2014  . Dizziness 06/22/2014  . Congestive heart disease (HCC)   . AKI (acute kidney injury) (HCC)   . Acute systolic heart failure  (HCC)   . Acute congestive heart failure (HCC) 06/19/2014  . Tobacco abuse 06/19/2014  . Essential hypertension 06/19/2014  . Hypertensive emergency 06/19/2014  . Elevated troponin 06/19/2014  . Leukocytosis 06/19/2014    Past Surgical History:  Procedure Laterality Date  . HYDROCELE EXCISION    . KNEE SURGERY Right 2013  . LEFT HEART CATHETERIZATION WITH CORONARY ANGIOGRAM N/A 06/21/2014   Procedure: LEFT HEART CATHETERIZATION WITH CORONARY ANGIOGRAM;  Surgeon: Kathleene Hazel, MD;  Location: [facility] CATH LAB;  Service: Cardiovascular;  Laterality: N/A;  . VASECTOMY         Home Medications    Prior to Admission medications   Medication Sig Start Date End Date Taking? Authorizing Provider  aspirin EC 325 MG EC tablet Take 1 tablet (325 mg total) by mouth daily. 03/12/16   Dron Jaynie Collins, MD  carvedilol (COREG) 6.25 MG tablet Take 6.25 mg by mouth 2 (two) times daily with a meal.    Historical Provider, MD  furosemide (LASIX) 40 MG tablet Take 1 tablet (40 mg total) by mouth 2 (two) times daily. 03/11/16   Dron Jaynie Collins, MD  losartan (COZAAR) 25 MG tablet Take 1 tablet (25 mg total) by mouth daily. 03/12/16   Dron Jaynie Collins, MD  NON FORMULARY Electromagnetic (all-body) beamer/stimulator: Lay on mat 8-12 minutes (per session) twice a day to stimulate vagus nerve activity    Historical Provider, MD  OVER THE COUNTER MEDICATION Colloidal Silver: Take 30 ml's by  mouth once a day    Historical Provider, MD  OVER THE COUNTER MEDICATION Essential oils: One drop of peppermint oil applied to the tongue as needed for coughing    Historical Provider, MD  potassium chloride (KLOR-CON M15) 15 MEQ tablet Take 2 tablets (30 mEq total) by mouth daily. 03/12/16   Dron Jaynie CollinsPrasad Bhandari, MD    Family History Family History  Problem Relation Age of Onset  . Stroke Mother   . Heart disease      Social History Social History  Substance Use Topics  . Smoking status: Current  Every Day Smoker    Packs/day: 1.00    Years: 46.00    Types: Cigars, Cigarettes  . Smokeless tobacco: Current User  . Alcohol use No     Allergies   Penicillins   Review of Systems Review of Systems  Unable to perform ROS: Severe respiratory distress     Physical Exam Updated Vital Signs BP (!) 184/128   Pulse (!) 122   Resp (!) 40   SpO2 93%   Physical Exam  Constitutional: He is oriented to person, place, and time. He appears well-developed and well-nourished. He appears distressed.  HENT:  Head: Normocephalic and atraumatic.  Eyes: EOM are normal.  Neck: Normal range of motion.  Cardiovascular: Regular rhythm, normal heart sounds and intact distal pulses.  Tachycardia present.   Pulmonary/Chest: Accessory muscle usage present. Tachypnea noted. He is in respiratory distress. He has rales in the right lower field and the left lower field.  Abdominal: Soft. He exhibits no distension. There is no tenderness.  Musculoskeletal: Normal range of motion.  Neurological: He is alert and oriented to person, place, and time.  Skin: Skin is warm. He is diaphoretic.  Psychiatric: He has a normal mood and affect. Judgment normal.  Nursing note and vitals reviewed.    ED Treatments / Results  DIAGNOSTIC STUDIES: Oxygen Saturation is 93% on 15 Liters face mask, low by my interpretation.    COORDINATION OF CARE: 9:58 PM Discussed treatment plan with pt at bedside and pt agreed to plan.   Labs (all labs ordered are listed, but only abnormal results are displayed) Labs Reviewed  CBC WITH DIFFERENTIAL/PLATELET - Abnormal; Notable for the following:       Result Value   Lymphs Abs 4.7 (*)    All other components within normal limits  COMPREHENSIVE METABOLIC PANEL - Abnormal; Notable for the following:    Glucose, Bld 306 (*)    BUN 32 (*)    Creatinine, Ser 1.66 (*)    Calcium 8.6 (*)    Albumin 3.4 (*)    AST 71 (*)    GFR calc non Af Amer 42 (*)    GFR calc Af Amer 48  (*)    All other components within normal limits  TROPONIN I - Abnormal; Notable for the following:    Troponin I 0.08 (*)    All other components within normal limits  BRAIN NATRIURETIC PEPTIDE - Abnormal; Notable for the following:    B Natriuretic Peptide 1,499.0 (*)    All other components within normal limits  I-STAT CG4 LACTIC ACID, ED - Abnormal; Notable for the following:    Lactic Acid, Venous 3.61 (*)    All other components within normal limits  LACTIC ACID, PLASMA    EKG  EKG Interpretation  Date/Time:  Sunday March 11 2016 21:41:57 EST Ventricular Rate:  124 PR Interval:    QRS Duration: 93 QT Interval:  319 QTC Calculation: 459 R Axis:   -2 Text Interpretation:  Sinus tachycardia Left atrial enlargement Left ventricular hypertrophy Abnormal T, consider ischemia, lateral leads No significant change was found Confirmed by Aziyah Provencal  MD, Caryn Bee ([id number]) on 03/11/2016 9:44:50 PM       Radiology Dg Chest Portable 1 View  Result Date: 03/11/2016 CLINICAL DATA:  Shortness of breath and diaphoresis. History of congestive heart failure, hypertension, and smoker. EXAM: PORTABLE CHEST 1 VIEW COMPARISON:  03/07/2016 FINDINGS: Cardiac enlargement with pulmonary vascular congestion. Bilateral interstitial and perihilar alveolar infiltration. Infiltrates are progressing since previous study and likely indicating edema. Bilateral pneumonia could also have this appearance. Probable small left pleural effusion. No pneumothorax. Calcification and torsion of the aorta. IMPRESSION: Progressing changes of congestive heart failure since previous study with developing interstitial and alveolar edema. Small left pleural effusion. Electronically Signed   By: Burman Nieves M.D.   On: 03/11/2016 21:58    Procedures Procedures (including critical care time)   +++++++++++++++++++++++++++++++++++++++  CRITICAL CARE Performed by: Lyanne Co Total critical care time: 35  minutes Critical care time was exclusive of separately billable procedures and treating other patients. Critical care was necessary to treat or prevent imminent or life-threatening deterioration. Critical care was time spent personally by me on the following activities: development of treatment plan with patient and/or surrogate as well as nursing, discussions with consultants, evaluation of patient's response to treatment, examination of patient, obtaining history from patient or surrogate, ordering and performing treatments and interventions, ordering and review of laboratory studies, ordering and review of radiographic studies, pulse oximetry and re-evaluation of patient's condition.  +++++++++++++++++++++++++++++++++++++++   Medications Ordered in ED Medications  nitroGLYCERIN (NITROSTAT) SL tablet 0.4 mg (not administered)  nitroGLYCERIN 50 mg in dextrose 5 % 250 mL (0.2 mg/mL) infusion (not administered)     Initial Impression / Assessment and Plan / ED Course  I have reviewed the triage vital signs and the nursing notes.  Pertinent labs & imaging results that were available during my care of the patient were reviewed by me and considered in my medical decision making (see chart for details).  Clinical Course     Likely acute flash pulmonary edema.  Patient was an extremist on arrival.  Required emergent BiPAP.  Patient given sublingual nitroglycerin and started on nitroglycerin drip for significant hypertension.  IV Lasix given.  At time of discharge earlier today seemed to be doing well and was feeling much better.  He went home and out of otherwise normal day.  It sounds as though this suddenly came on him.  Doubt PE.  Doubt acute coronary syndrome.  No active chest pain this time.   11:12 PM Patient looks and feels much better at this time.  Patient is able to be weaned from BiPAP.  Likely flash pulmonary edema.  Nitroglycerin drip continues at this time.  Improvement in his  blood pressure.  Final Clinical Impressions(s) / ED Diagnoses   Final diagnoses:  Flash pulmonary edema (HCC)  Acute respiratory failure with hypoxia (HCC)  Acute congestive heart failure, unspecified congestive heart failure type [facility])    New Prescriptions New Prescriptions   No medications on file   I personally performed the services described in this documentation, which was scribed in my presence. The recorded information has been reviewed and is accurate.        Azalia Bilis, MD 03/11/16 [phone]

## 2016-03-11 NOTE — ED Triage Notes (Addendum)
Pt arrived to er by [facility] with c/o sob, diaphoretic, unable to complete sentences, jvd noted, Dr Patria Mane at bedside, pt was given 3 SL nitro by ems due to sob, CHF symptoms,

## 2016-03-11 NOTE — Progress Notes (Signed)
I have sent a message to our [facility] office's scheduler requesting a follow-up appointment, and our office will call the patient with this information (transition of care). Gracielynn Birkel PA-C

## 2016-03-11 NOTE — Progress Notes (Signed)
Patient is discharge to home accompanied by patient's spouse and NT via wheelchair. Discharge instructions given . Patient verbalizes understanding. All personal belongings given. Telemetry box and IV removed prior to discharge and site in good condition.  

## 2016-03-11 NOTE — Discharge Summary (Signed)
Physician Discharge Summary  Wesley Riley:[id number] DOB: 1951-01-06 DOA: 03/07/2016  PCP: [name], DO  Admit date: 03/07/2016 Discharge date: 03/11/2016  Admitted From:home Disposition:home  Recommendations for Outpatient Follow-up:  1. Follow up with PCP in 1 week 2. Please obtain BMP/CBC in one week   Home Health:no Equipment/Devices:no Discharge Condition:stable CODE STATUS:full Diet recommendation:heart healthy.  Brief/Interim Summary:65 y.o.malewith known history of nonischemic cardiomyopathy diagnosed last year and at that time patient had undergone cardiac cath and coronary unremarkable presents to the ER because of worsening shortness of breath over the last 1 week. Patient with acute congestive heart failure.  # Acute systolic congestive heart failure with pulmonary edema: BNP of 1082 on admission. Chest x-ray consistent with pulmonary edema. Patient was not taking medication at home.  -echo showed depressed LVEF of 30-35 % -Patient clinically improved. Oxygen saturation acceptable in room air. Feels good. Changed Lasix from IV to oral. Patient was evaluated by cardiologist, medications adjusted and advised outpatient follow-up. -Continue aspirin, Coreg. -added cozaar 25 mg   #Elevated troponin level: Peaked troponin of 0.33. Likely related with congestive heart failure. Monitor in telemetry. Treated for CHF. Recommended outpatient follow-up with PCP and cardiologist.  #Nonsustained V. tach: Continue in telemeter. Improved.  #Acute respiratory failure with hypoxia (HCC) due to acute pulmonary edema: Treated with IV diuretics with improvement in clinical symptoms. Today patient is in room year maintaining oxygen saturation.   #Hypokalemia: Repleted potassium today and patient is discharged with oral potassium. I advised patient to check BMP in a week. Patient verbalized understanding.   #Chronic kidney disease stage III: Serum creatinine level around  baseline. Monitor BMP.  Patient with significant clinical improvement, currently in room air, serum creatinine level around baseline. Evaluated by cardiologist. Discharge with Lasix, Coreg, aspirin, Cozaar. Recommended outpatient lab check and follow-ups.  Discharge Diagnoses:  Active Problems:   Tobacco abuse   Essential hypertension   Acute systolic heart failure (HCC)   Acute respiratory failure with hypoxia (HCC)   CHF (congestive heart failure) (HCC)   Acute pulmonary edema [facility])    Discharge Instructions  Discharge Instructions    Call MD for:  difficulty breathing, headache or visual disturbances    Complete by:  As directed    Call MD for:  hives    Complete by:  As directed    Call MD for:  persistant dizziness or light-headedness    Complete by:  As directed    Call MD for:  persistant nausea and vomiting    Complete by:  As directed    Call MD for:  severe uncontrolled pain    Complete by:  As directed    Call MD for:  temperature >100.4    Complete by:  As directed    Diet - low sodium heart healthy    Complete by:  As directed    Discharge instructions    Complete by:  As directed    Please take low-salt diet Please check lab, BMP in a week either with your PCP or cardiologist.   Increase activity slowly    Complete by:  As directed        Medication List    STOP taking these medications   hydrALAZINE 100 MG tablet Commonly known as:  APRESOLINE     TAKE these medications   aspirin 325 MG EC tablet Take 1 tablet (325 mg total) by mouth daily. Start taking on:  03/12/2016   carvedilol 6.25 MG tablet Commonly known as:  COREG Take 6.25 mg by mouth 2 (two) times daily with a meal. What changed:  Another medication with the same name was removed. Continue taking this medication, and follow the directions you see here.   furosemide 40 MG tablet Commonly known as:  LASIX Take 1 tablet (40 mg total) by mouth 2 (two) times daily. What changed:  when  to take this  reasons to take this  Another medication with the same name was removed. Continue taking this medication, and follow the directions you see here.   losartan 25 MG tablet Commonly known as:  COZAAR Take 1 tablet (25 mg total) by mouth daily. Start taking on:  03/12/2016   NON FORMULARY Electromagnetic (all-body) beamer/stimulator: Lay on mat 8-12 minutes (per session) twice a day to stimulate vagus nerve activity   OVER THE COUNTER MEDICATION Colloidal Silver: Take 30 ml's by mouth once a day   OVER THE COUNTER MEDICATION Essential oils: One drop of peppermint oil applied to the tongue as needed for coughing   potassium chloride SA 15 MEQ tablet Commonly known as:  KLOR-CON M15 Take 2 tablets (30 mEq total) by mouth daily. Start taking on:  03/12/2016      Follow-up Information    [name], DO. Schedule an appointment as soon as possible for a visit in 1 week(s).   Contact information: [address] [place] [address] [phone]        [name], MD. Schedule an appointment as soon as possible for a visit in 2 week(s).   Specialty:  Cardiology Contact information: [address] [place] [address] [place] [address] [phone]          Allergies  Allergen Reactions  . Penicillins Hives and Rash    Has patient had a PCN reaction causing immediate rash, facial/tongue/throat swelling, SOB or lightheadedness with hypotension: Yes Has patient had a PCN reaction causing severe rash involving mucus membranes or skin necrosis: No Has patient had a PCN reaction that required hospitalization: No Has patient had a PCN reaction occurring within the last 10 years: No If all of the above answers are "NO", then may proceed with Cephalosporin use.     Consultations: Cardiologist  Procedures/Studies: Echo  Subjective: Patient was seen and examined at bedside. Patient reported feeling much better today and ready to go home. Denied headache, dizziness,  chest pain, shortness of breath, nausea, vomiting, abdominal pain. Denies urinary symptoms. Verbalized understanding of follow-up instructions.   Discharge Exam: Vitals:   03/10/16 2102 03/11/16 0541  BP: 133/77 132/82  Pulse: 68 66  Resp: 18 18  Temp: 97.8 F (36.6 C) 97.9 F (36.6 C)   Vitals:   03/10/16 0637 03/10/16 1212 03/10/16 2102 03/11/16 0541  BP: 130/83 126/77 133/77 132/82  Pulse: 61 62 68 66  Resp: 18 20 18 18   Temp: 97.7 F (36.5 C) 97.7 F (36.5 C) 97.8 F (36.6 C) 97.9 F (36.6 C)  TempSrc: Oral Oral Oral Oral  SpO2: 95% 96% 96% 94%  Weight: 81 kg (178 lb 9.6 oz)   80.7 kg (178 lb)  Height:        General: Pt is alert, awake, not in acute distress Cardiovascular: RRR, S1/S2 +, no rubs, no gallops Respiratory: CTA bilaterally, no wheezing, no rhonchi Abdominal: Soft, NT, ND, bowel sounds + Extremities: no edema, no cyanosis    The results of significant diagnostics from this hospitalization (including imaging, microbiology, ancillary and laboratory) are listed below for reference.  Microbiology: No results found for this or any previous visit (from the past 240 hour(s)).   Labs: BNP (last 3 results)  Recent Labs  03/07/16 2308  BNP 1,082.8*   Basic Metabolic Panel:  Recent Labs Lab 03/07/16 2302 03/08/16 0537 03/09/16 0309 03/09/16 0929 03/09/16 1545 03/10/16 0408 03/11/16 0238  NA 138  --  141  --   --  142 140  K 3.2*  --  2.9*  --  3.7 3.2* 3.1*  CL 106  --  106  --   --  103 104  CO2 25  --  27  --   --  30 27  GLUCOSE 128*  --  114*  --   --  100* 101*  BUN 24*  --  31*  --   --  26* 24*  CREATININE 1.58* 1.48* 1.62*  --   --  1.60* 1.59*  CALCIUM 8.5*  --  8.6*  --   --  8.1* 8.3*  MG  --   --   --  1.8  --   --   --    Liver Function Tests:  Recent Labs Lab 03/07/16 2302  AST 71*  ALT 46  ALKPHOS 83  BILITOT 0.4  PROT 6.3*  ALBUMIN 3.1*   No results for input(s): LIPASE, AMYLASE in the last 168 hours. No  results for input(s): AMMONIA in the last 168 hours. CBC:  Recent Labs Lab 03/07/16 2302 03/08/16 0537  WBC 12.4* 6.7  NEUTROABS 10.8*  --   HGB 13.3 13.9  HCT 37.9* 39.9  MCV 95.2 94.5  PLT 179 181   Cardiac Enzymes:  Recent Labs Lab 03/08/16 0537 03/08/16 1107 03/08/16 1558  TROPONINI 0.33* 0.21* 0.19*   BNP: Invalid input(s): POCBNP CBG: No results for input(s): GLUCAP in the last 168 hours. D-Dimer No results for input(s): DDIMER in the last 72 hours. Hgb A1c No results for input(s): HGBA1C in the last 72 hours. Lipid Profile No results for input(s): CHOL, HDL, LDLCALC, TRIG, CHOLHDL, LDLDIRECT in the last 72 hours. Thyroid function studies No results for input(s): TSH, T4TOTAL, T3FREE, THYROIDAB in the last 72 hours.  Invalid input(s): FREET3 Anemia work up No results for input(s): VITAMINB12, FOLATE, FERRITIN, TIBC, IRON, RETICCTPCT in the last 72 hours. Urinalysis No results found for: COLORURINE, APPEARANCEUR, LABSPEC, PHURINE, GLUCOSEU, HGBUR, BILIRUBINUR, KETONESUR, PROTEINUR, UROBILINOGEN, NITRITE, LEUKOCYTESUR Sepsis Labs Invalid input(s): PROCALCITONIN,  WBC,  LACTICIDVEN Microbiology No results found for this or any previous visit (from the past 240 hour(s)).   Time coordinating discharge: 26 minutes  SIGNED:   Maxie Barbron Prasad Nicklous Aburto, MD  [facility] 03/11/2016, 10:45 AM  If 7PM-7AM, please contact night-coverage [email or web address] Password [email or web address]

## 2016-03-11 NOTE — Progress Notes (Signed)
Patient Name: Wesley Riley Date of Encounter: 03/11/2016  Primary Cardiologist: Dr [facility] Problem List     Active Problems:   Tobacco abuse   Essential hypertension   Acute systolic heart failure (HCC)   Acute respiratory failure with hypoxia (HCC)   CHF (congestive heart failure) (HCC)   Acute pulmonary edema (HCC)     Subjective   Dyspnea resolved; no chest pain; continues with cough but improving  Inpatient Medications    Scheduled Meds: . aspirin EC  325 mg Oral Daily  . carvedilol  6.25 mg Oral BID WC  . enoxaparin (LOVENOX) injection  40 mg Subcutaneous Daily  . furosemide  40 mg Oral BID  . losartan  25 mg Oral Daily  . magnesium oxide  400 mg Oral BID  . potassium chloride  30 mEq Oral Daily   Continuous Infusions:  PRN Meds: acetaminophen **OR** acetaminophen, guaiFENesin-dextromethorphan, ondansetron **OR** ondansetron (ZOFRAN) IV, phenol   Vital Signs    Vitals:   03/10/16 0637 03/10/16 1212 03/10/16 2102 03/11/16 0541  BP: 130/83 126/77 133/77 132/82  Pulse: 61 62 68 66  Resp: 18 20 18 18   Temp: 97.7 F (36.5 C) 97.7 F (36.5 C) 97.8 F (36.6 C) 97.9 F (36.6 C)  TempSrc: Oral Oral Oral Oral  SpO2: 95% 96% 96% 94%  Weight: 178 lb 9.6 oz (81 kg)   178 lb (80.7 kg)  Height:        Intake/Output Summary (Last 24 hours) at 03/11/16 0917 Last data filed at 03/11/16 0839  Gross per 24 hour  Intake             1440 ml  Output             2650 ml  Net            -1210 ml   Filed Weights   03/09/16 0515 03/10/16 0637 03/11/16 0541  Weight: 179 lb (81.2 kg) 178 lb 9.6 oz (81 kg) 178 lb (80.7 kg)    Physical Exam   GEN: Well nourished, well developed, in no acute distress.  HEENT: Grossly normal.  Neck: Supple Cardiac: RRR Respiratory:  CTA GI: Soft, nontender, nondistended MS: no deformity or atrophy. Skin: warm and dry, no rash. Neuro:  Strength and sensation are intact. Psych: AAOx3.  Normal affect.  Labs      Basic Metabolic Panel  Recent Labs  03/09/16 0929  03/10/16 0408 03/11/16 0238  NA  --   --  142 140  K  --   < > 3.2* 3.1*  CL  --   --  103 104  CO2  --   --  30 27  GLUCOSE  --   --  100* 101*  BUN  --   --  26* 24*  CREATININE  --   --  1.60* 1.59*  CALCIUM  --   --  8.1* 8.3*  MG 1.8  --   --   --   < > = values in this interval not displayed. Cardiac Enzymes  Recent Labs  03/08/16 1107 03/08/16 1558  TROPONINI 0.21* 0.19*      Patient Profile     65 year old male with history of nonischemic cardiomyopathy (previous catheterization normal); LV function improved; discontinued his medications in January this year. Presented with acute systolic congestive heart failure symptoms. Follow-up echocardiogram shows ejection fraction 30-35%.   Assessment & Plan    1 acute systolic congestive heart failure-volume status improved.  Continue lasix 40 mg BID; check BMET 11/24.   2 nonischemic cardiomyopathy-LV function is now reduced again after patient discontinued his medications. Continue carvedilol and cozaar; titrate meds as outpt; fu echo 3 months later.   3 chronic stage III kidney disease-follow renal function closely as outpt  4 hypertension-blood pressure controlled. Continue present medications.   5 elevated troponin-no clear trend and not consistent with acute coronary syndrome. Previous catheterization showed no coronary disease.  6 hypokalemia-supplement  Pt can be DCed from a cardiac standpoint and fu with [place] Purvis [place]waran [place].  Signed, Olga MillersBrian Crenshaw, MD  03/11/2016, 9:17 AM

## 2016-03-12 ENCOUNTER — Encounter (HOSPITAL_COMMUNITY): Payer: Self-pay | Admitting: Cardiology

## 2016-03-12 ENCOUNTER — Observation Stay (HOSPITAL_COMMUNITY): Payer: Medicare Other

## 2016-03-12 ENCOUNTER — Telehealth: Payer: Self-pay | Admitting: Cardiovascular Disease

## 2016-03-12 DIAGNOSIS — N183 Chronic kidney disease, stage 3 unspecified: Secondary | ICD-10-CM | POA: Diagnosis present

## 2016-03-12 DIAGNOSIS — I10 Essential (primary) hypertension: Secondary | ICD-10-CM | POA: Diagnosis not present

## 2016-03-12 DIAGNOSIS — I5043 Acute on chronic combined systolic (congestive) and diastolic (congestive) heart failure: Secondary | ICD-10-CM | POA: Diagnosis not present

## 2016-03-12 DIAGNOSIS — I248 Other forms of acute ischemic heart disease: Secondary | ICD-10-CM | POA: Diagnosis present

## 2016-03-12 DIAGNOSIS — J9601 Acute respiratory failure with hypoxia: Secondary | ICD-10-CM | POA: Diagnosis not present

## 2016-03-12 DIAGNOSIS — F17290 Nicotine dependence, other tobacco product, uncomplicated: Secondary | ICD-10-CM | POA: Diagnosis present

## 2016-03-12 DIAGNOSIS — I5041 Acute combined systolic (congestive) and diastolic (congestive) heart failure: Secondary | ICD-10-CM

## 2016-03-12 DIAGNOSIS — Z880 Allergy status to penicillin: Secondary | ICD-10-CM | POA: Diagnosis not present

## 2016-03-12 DIAGNOSIS — J810 Acute pulmonary edema: Secondary | ICD-10-CM | POA: Diagnosis present

## 2016-03-12 DIAGNOSIS — R748 Abnormal levels of other serum enzymes: Secondary | ICD-10-CM | POA: Diagnosis not present

## 2016-03-12 DIAGNOSIS — F17210 Nicotine dependence, cigarettes, uncomplicated: Secondary | ICD-10-CM | POA: Diagnosis present

## 2016-03-12 DIAGNOSIS — I130 Hypertensive heart and chronic kidney disease with heart failure and stage 1 through stage 4 chronic kidney disease, or unspecified chronic kidney disease: Secondary | ICD-10-CM | POA: Diagnosis present

## 2016-03-12 DIAGNOSIS — E876 Hypokalemia: Secondary | ICD-10-CM | POA: Diagnosis not present

## 2016-03-12 DIAGNOSIS — I428 Other cardiomyopathies: Secondary | ICD-10-CM | POA: Diagnosis not present

## 2016-03-12 DIAGNOSIS — Z79899 Other long term (current) drug therapy: Secondary | ICD-10-CM | POA: Diagnosis not present

## 2016-03-12 DIAGNOSIS — Z9114 Patient's other noncompliance with medication regimen: Secondary | ICD-10-CM | POA: Diagnosis not present

## 2016-03-12 DIAGNOSIS — Z7982 Long term (current) use of aspirin: Secondary | ICD-10-CM | POA: Diagnosis not present

## 2016-03-12 DIAGNOSIS — I472 Ventricular tachycardia: Secondary | ICD-10-CM | POA: Diagnosis present

## 2016-03-12 LAB — TROPONIN I
TROPONIN I: 0.26 ng/mL — AB (ref <0.03)
TROPONIN I: 0.17 ng/mL — AB (ref <0.03)
Troponin I: 0.22 ng/mL (ref <0.03)

## 2016-03-12 LAB — MRSA PCR SCREENING: MRSA by PCR: NEGATIVE

## 2016-03-12 MED ORDER — ACETAMINOPHEN 650 MG RE SUPP: 650.0000 mg | Freq: Four times a day (QID) | RECTAL | Status: DC | PRN

## 2016-03-12 MED ORDER — LOSARTAN POTASSIUM 50 MG PO TABS
25.0000 mg | ORAL_TABLET | Freq: Every day | ORAL | Status: DC
  Administered 2016-03-12: 25 mg via ORAL
  Filled 2016-03-12: qty 1

## 2016-03-12 MED ORDER — ASPIRIN EC 325 MG PO TBEC
325.0000 mg | DELAYED_RELEASE_TABLET | Freq: Every day | ORAL | Status: DC
  Administered 2016-03-12 – 2016-03-14 (×3): 325 mg via ORAL
  Filled 2016-03-12 (×3): qty 1

## 2016-03-12 MED ORDER — ISOSORB DINITRATE-HYDRALAZINE 20-37.5 MG PO TABS
1.0000 | ORAL_TABLET | Freq: Three times a day (TID) | ORAL | Status: DC
  Administered 2016-03-12 – 2016-03-14 (×7): 1 via ORAL
  Filled 2016-03-12 (×13): qty 1

## 2016-03-12 MED ORDER — LOSARTAN POTASSIUM 25 MG PO TABS
12.5000 mg | ORAL_TABLET | Freq: Every day | ORAL | Status: DC
  Administered 2016-03-13 – 2016-03-14 (×2): 12.5 mg via ORAL
  Filled 2016-03-12 (×4): qty 0.5

## 2016-03-12 MED ORDER — ISOSORBIDE MONONITRATE ER 30 MG PO TB24: 30.0000 mg | ORAL_TABLET | Freq: Every day | ORAL | Status: DC

## 2016-03-12 MED ORDER — ACETAMINOPHEN 325 MG PO TABS: 650.0000 mg | ORAL_TABLET | Freq: Four times a day (QID) | ORAL | Status: DC | PRN

## 2016-03-12 MED ORDER — SODIUM CHLORIDE 0.9% FLUSH
3.0000 mL | Freq: Two times a day (BID) | INTRAVENOUS | Status: DC
  Administered 2016-03-12 (×2): 3 mL via INTRAVENOUS

## 2016-03-12 MED ORDER — ONDANSETRON HCL 4 MG/2ML IJ SOLN: 4.0000 mg | Freq: Four times a day (QID) | INTRAMUSCULAR | Status: DC | PRN

## 2016-03-12 MED ORDER — FUROSEMIDE 10 MG/ML IJ SOLN
40.0000 mg | Freq: Two times a day (BID) | INTRAMUSCULAR | Status: DC
  Administered 2016-03-12 – 2016-03-13 (×3): 40 mg via INTRAVENOUS
  Filled 2016-03-12 (×3): qty 4

## 2016-03-12 MED ORDER — CARVEDILOL 3.125 MG PO TABS
6.2500 mg | ORAL_TABLET | Freq: Two times a day (BID) | ORAL | Status: DC
  Administered 2016-03-12 – 2016-03-14 (×5): 6.25 mg via ORAL
  Filled 2016-03-12 (×5): qty 2

## 2016-03-12 MED ORDER — ENOXAPARIN SODIUM 40 MG/0.4ML ~~LOC~~ SOLN
40.0000 mg | SUBCUTANEOUS | Status: DC
  Filled 2016-03-12 (×2): qty 0.4

## 2016-03-12 MED ORDER — FUROSEMIDE 10 MG/ML IJ SOLN
20.0000 mg | Freq: Two times a day (BID) | INTRAMUSCULAR | Status: DC
  Administered 2016-03-12: 20 mg via INTRAVENOUS
  Filled 2016-03-12: qty 2

## 2016-03-12 MED ORDER — POTASSIUM CHLORIDE CRYS ER 20 MEQ PO TBCR
30.0000 meq | EXTENDED_RELEASE_TABLET | Freq: Every day | ORAL | Status: DC
  Administered 2016-03-12 – 2016-03-13 (×2): 30 meq via ORAL
  Filled 2016-03-12 (×2): qty 1

## 2016-03-12 MED ORDER — LORAZEPAM 2 MG/ML IJ SOLN
1.0000 mg | Freq: Once | INTRAMUSCULAR | Status: AC | PRN
  Administered 2016-03-12: 1 mg via INTRAVENOUS
  Filled 2016-03-12: qty 1

## 2016-03-12 MED ORDER — SODIUM CHLORIDE 0.9 % IV SOLN: 250.0000 mL | INTRAVENOUS | Status: DC | PRN

## 2016-03-12 MED ORDER — SODIUM CHLORIDE 0.9% FLUSH: 3.0000 mL | INTRAVENOUS | Status: DC | PRN

## 2016-03-12 MED ORDER — ONDANSETRON HCL 4 MG PO TABS
4.0000 mg | ORAL_TABLET | Freq: Four times a day (QID) | ORAL | Status: DC | PRN
  Administered 2016-03-14: 4 mg via ORAL
  Filled 2016-03-12: qty 1

## 2016-03-12 NOTE — Care Management Note (Signed)
Case Management Note  Patient Details  Name: Wesley Riley MRN: [id number] Date of Birth: 09/22/1950  Subjective/Objective:                  Admitted with CHF. Pt is from home, lives with his wife. He is ind with ADL's. He has PCP, drives himself and has no difficulty affording medications. His wife manages his medications for him. He has a scale and weighs himself daily. He has no DME and plans to return home with self care at DC.   Action/Plan: No CM needs anticipated, will cont to follow.   Expected Discharge Date:    03/15/2016              Expected Discharge Plan:  Home/Self Care  In-House Referral:  NA  Discharge planning Services  CM Consult  Post Acute Care Choice:  NA Choice offered to:  NA  Status of Service:  In process, will continue to follow   Malcolm Metro, RN 03/12/2016, 2:38 PM

## 2016-03-12 NOTE — Progress Notes (Signed)
Pharmacy Consult: Lovenox for VTE prophylaxis.  Patient Measurements: Height: 5\' 9"  (175.3 cm) Weight: 177 lb 7.5 oz (80.5 kg) IBW/kg (Calculated) : 70.7 Body mass index is 26.21 kg/m.   VITALS Vitals:   03/12/16 1100 03/12/16 1107  BP: (!) 128/93   Pulse: 64 70  Resp: (!) 21 16  Temp:  97.5 F (36.4 C)    INR Last Three Days: No results for input(s): INR in the last 72 hours.  CBC:    Component Value Date/Time   WBC 10.2 03/11/2016 2142   RBC 4.41 03/11/2016 2142   HGB 14.7 03/11/2016 2142   HCT 43.6 03/11/2016 2142   HCT 35.5 (L) 06/21/2014 0010   PLT 220 03/11/2016 2142   MCV 98.9 03/11/2016 2142   MCH 33.3 03/11/2016 2142   MCHC 33.7 03/11/2016 2142   RDW 13.5 03/11/2016 2142   LYMPHSABS 4.7 (H) 03/11/2016 2142   MONOABS 0.8 03/11/2016 2142   EOSABS 0.3 03/11/2016 2142   BASOSABS 0.1 03/11/2016 2142    RENAL FUNCTION: Estimated Creatinine Clearance: 44.4 mL/min (by C-G formula based on SCr of 1.66 mg/dL (H)).  Assessment: Dose stable for age, weight, renal function and indication.  Plan: Continue lovenox 40mg  sq q24h Sign off.  Elder Cyphers, BS Loura Back, [place] Clinical Pharmacist Pager [phone] 03/12/2016 11:38 AM

## 2016-03-12 NOTE — Telephone Encounter (Signed)
1-2 weeks Appointment Type: Transition of Care

## 2016-03-12 NOTE — Telephone Encounter (Signed)
LM to call back.

## 2016-03-12 NOTE — Progress Notes (Signed)
PROGRESS NOTE                                                                                                                                                                                                             Patient Demographics:    Wesley Riley, is a 65 y.o. male, DOB - June 07, 1950, XBJ:[id number]  Admit date - 03/11/2016   Admitting Physician [name], MD  Outpatient Primary MD for the patient is [name], DO  LOS - 0  Chief Complaint  Patient presents with  . Respiratory Distress       Brief Narrative      Wesley Riley  is a 65 y.o. male, With history of nonischemic cardiopathy, chronic systolic CHF EF 30-35%, grade 2 diastolic dysfunction, [facility] Patient says he went home he was initially fine but started having shortness of breath around 8 PM. Phonating new complaint was cough when he went home. He denies chest pain, nausea or vomiting. When shortness of breath became worse he came to the ED.  In the ED initially he required BiPAP, started nitroglycerin drip & was given IV Lasix 60 mg 1.      Subjective:    Wesley CooperStephen Kiang today has, No headache, No chest pain, No abdominal pain - No Nausea, No new weakness tingling or numbness, No Cough - Improved SOB.    Assessment  & Plan :    Principal Problem:   Acute on chronic combined systolic and diastolic heart failure (HCC) Active Problems:   Tobacco abuse   Nonischemic cardiomyopathy (HCC)   Acute respiratory failure with hypoxia (HCC)   Pulmonary edema   CKD (chronic kidney disease), symptom management only, stage 4 (severe) (HCC)   1.Acute hypoxic respiratory failure due to acute on chronic combined systolic and diastolic CHF EF 30%35%. Initially required BiPAP and nitro drip, now improved and on nasal cannula, continue diuresis, continue Coreg, continue ARB, have added BiDil, cardiology on board. Since he  was recently compensated and discharged earlier on the day of admission and then developed flash pulmonary edema suddenly will try to rule out renal artery stenosis and ischemic cardiac event.  2. HX of hypertensive crisis, now flash pulmonary edema. Rule out renal artery stenosis with MRA abdomen.  3. CK D stage IV. Baseline creatinine around 1.5-1.6.  We will monitor.  4. Mildly elevated troponin. In non-ACS pattern, EKG stable, he is chest pain-free, likely mild demand ischemia from #1 above. Follow trend. On aspirin, beta blocker which will be continued. Cardiology on board.  5. Hypertension. Stable on Coreg, ARB and BiDil. Have reduced ARB dose to provide room for BiDil, also his potassium was top normal.    Family Communication  :  wife  Code Status :  Full  Diet : Diet Heart Room service appropriate? Yes; Fluid consistency: Thin; Fluid restriction: 1500 mL Fluid    Disposition Plan  :  Tele  Consults  :  cards  Procedures  :    MRA Renal Arteries   DVT Prophylaxis  :  Lovenox   Lab Results  Component Value Date   PLT 220 03/11/2016    Inpatient Medications  Scheduled Meds: . aspirin  325 mg Oral Daily  . carvedilol  6.25 mg Oral BID WC  . enoxaparin (LOVENOX) injection  40 mg Subcutaneous Q24H  . furosemide  40 mg Intravenous BID  . isosorbide-hydrALAZINE  1 tablet Oral TID  . [START ON 03/13/2016] losartan  12.5 mg Oral Daily  . potassium chloride SA  30 mEq Oral Daily   Continuous Infusions: PRN Meds:.acetaminophen **OR** [DISCONTINUED] acetaminophen, ondansetron **OR** ondansetron (ZOFRAN) IV  Antibiotics  :    Anti-infectives    None         Objective:   Vitals:   03/12/16 0700 03/12/16 0712 03/12/16 0800 03/12/16 0900  BP: (!) 134/93  (!) 134/114 (!) 130/96  Pulse: 66 67 74 70  Resp: (!) 27 (!) 27 (!) 24 19  Temp:  97.3 F (36.3 C)    TempSrc:  Oral    SpO2: 96% 95% 94% 98%  Weight:      Height:        Wt Readings from Last 3  Encounters:  03/12/16 80.5 kg (177 lb 7.5 oz)  03/11/16 80.7 kg (178 lb)  03/21/15 85.7 kg (189 lb)     Intake/Output Summary (Last 24 hours) at 03/12/16 0929 Last data filed at 03/12/16 0848  Gross per 24 hour  Intake              320 ml  Output             2100 ml  Net            -1780 ml     Physical Exam  Awake Alert, Oriented X 3, No new F.N deficits, Normal affect [place].AT,PERRAL Supple Neck,No JVD, No cervical lymphadenopathy appriciated.  Symmetrical Chest wall movement, Good air movement bilaterally, few rales RRR,No Gallops,Rubs or new Murmurs, No Parasternal Heave +ve B.Sounds, Abd Soft, No tenderness, No organomegaly appriciated, No rebound - guarding or rigidity. No Cyanosis, Clubbing or edema, No new Rash or bruise      Data Review:    CBC  Recent Labs Lab 03/07/16 2302 03/08/16 0537 03/11/16 2142  WBC 12.4* 6.7 10.2  HGB 13.3 13.9 14.7  HCT 37.9* 39.9 43.6  PLT 179 181 220  MCV 95.2 94.5 98.9  MCH 33.4 32.9 33.3  MCHC 35.1 34.8 33.7  RDW 13.6 13.6 13.5  LYMPHSABS 1.0  --  4.7*  MONOABS 0.5  --  0.8  EOSABS 0.1  --  0.3  BASOSABS 0.0  --  0.1    Chemistries   Recent Labs Lab 03/07/16 2302 03/08/16 0537 03/09/16 0309 03/09/16 0929 03/09/16 1545 03/10/16 0408 03/11/16 0238 03/11/16 2142  NA 138  --  141  --   --  142 140 140  K 3.2*  --  2.9*  --  3.7 3.2* 3.1* 4.8  CL 106  --  106  --   --  103 104 106  CO2 25  --  27  --   --  30 27 25   GLUCOSE 128*  --  114*  --   --  100* 101* 306*  BUN 24*  --  31*  --   --  26* 24* 32*  CREATININE 1.58* 1.48* 1.62*  --   --  1.60* 1.59* 1.66*  CALCIUM 8.5*  --  8.6*  --   --  8.1* 8.3* 8.6*  MG  --   --   --  1.8  --   --   --   --   AST 71*  --   --   --   --   --   --  71*  ALT 46  --   --   --   --   --   --  52  ALKPHOS 83  --   --   --   --   --   --  111  BILITOT 0.4  --   --   --   --   --   --  0.3    ------------------------------------------------------------------------------------------------------------------ No results for input(s): CHOL, HDL, LDLCALC, TRIG, CHOLHDL, LDLDIRECT in the last 72 hours.  Lab Results  Component Value Date   HGBA1C 5.8 (H) 06/20/2014   ------------------------------------------------------------------------------------------------------------------ No results for input(s): TSH, T4TOTAL, T3FREE, THYROIDAB in the last 72 hours.  Invalid input(s): FREET3 ------------------------------------------------------------------------------------------------------------------ No results for input(s): VITAMINB12, FOLATE, FERRITIN, TIBC, IRON, RETICCTPCT in the last 72 hours.  Coagulation profile No results for input(s): INR, PROTIME in the last 168 hours.  No results for input(s): DDIMER in the last 72 hours.  Cardiac Enzymes  Recent Labs Lab 03/11/16 2142 03/12/16 0126 03/12/16 0733  TROPONINI 0.08* 0.22* 0.26*   ------------------------------------------------------------------------------------------------------------------    Component Value Date/Time   BNP 1,499.0 (H) 03/11/2016 2142    Micro Results Recent Results (from the past 240 hour(s))  MRSA PCR Screening     Status: None   Collection Time: 03/12/16 12:15 AM  Result Value Ref Range Status   MRSA by PCR NEGATIVE NEGATIVE Final    Comment:        The GeneXpert MRSA Assay (FDA approved for NASAL specimens only), is one component of a comprehensive MRSA colonization surveillance program. It is not intended to diagnose MRSA infection nor to guide or monitor treatment for MRSA infections.     Radiology Reports Dg Chest 2 View  Result Date: 03/07/2016 CLINICAL DATA:  Shortness of breath and cough.  History of CHF. EXAM: CHEST  2 VIEW COMPARISON:  Chest radiograph June 21, 2014 FINDINGS: Cardiac silhouette is moderately enlarged, similar. Mildly tortuous atherosclerotic  aorta. Pulmonary vascular congestion mild interstitial prominence with LEFT lung base strandy densities. No pleural effusion or focal consolidation here lead no pneumothorax. Soft tissue planes and included osseous structure nonsuspicious. IMPRESSION: Cardiomegaly, pulmonary vascular congestion and mild interstitial prominence concerning for pulmonary edema. Bibasilar atelectasis. Electronically Signed   By: Awilda Metro M.D.   On: 03/07/2016 23:05   Dg Chest Portable 1 View  Result Date: 03/11/2016 CLINICAL DATA:  Shortness of breath and diaphoresis. History of congestive heart failure, hypertension, and smoker. EXAM: PORTABLE CHEST 1 VIEW COMPARISON:  03/07/2016 FINDINGS: Cardiac enlargement  with pulmonary vascular congestion. Bilateral interstitial and perihilar alveolar infiltration. Infiltrates are progressing since previous study and likely indicating edema. Bilateral pneumonia could also have this appearance. Probable small left pleural effusion. No pneumothorax. Calcification and torsion of the aorta. IMPRESSION: Progressing changes of congestive heart failure since previous study with developing interstitial and alveolar edema. Small left pleural effusion. Electronically Signed   By: Burman Nieves M.D.   On: 03/11/2016 21:58    Time Spent in minutes  30   Celeste Candelas K M.D on 03/12/2016 at 9:29 AM  Between 7am to 7pm - Pager - [phone]  After 7pm go to [email or web address] - password [facility]  [facility] -  Office  [phone]

## 2016-03-12 NOTE — Consult Note (Addendum)
Requesting provider: Susa Raring, MD Primary cardiologist: [name], MD Consulting cardiologist: Jonelle Sidle, MD  Reason for consultation: Congestive heart failure  Clinical Summary Mr. Frischman is a 65 y.o.male with past medical history outlined below, just discharged from [facility] yesterday after treatment for recurrent acute systolic heart failure. He has a history of uncontrolled hypertension and nonischemic cardiomyopathy, last saw Dr. [name] in November 2016, and had shown normalization of LVEF with medical therapy previously. Unfortunately, since that time he had stopped his medications and presented to see his primary care provider Dr. Charm Barges recently due to progressing shortness of breath and abdominal fullness. He was ultimately hospitalized.  Relatively soon after recent discharge he states that he felt more short of breath at home with intermittent coughing, nonproductive, no fevers or chills. Losartan was a new addition, he had not yet started it.  Echocardiogram from November 17 showed LVEF down to the range of 30-35% with grade 2 diastolic dysfunction. He has had mild troponin I elevations that are most likely secondary to heart failure rather than ACS. Cardiac catheterization from last year showed normal coronary arteries.  Allergies  Allergen Reactions  . Penicillins Hives and Rash    Has patient had a PCN reaction causing immediate rash, facial/tongue/throat swelling, SOB or lightheadedness with hypotension: Yes Has patient had a PCN reaction causing severe rash involving mucus membranes or skin necrosis: No Has patient had a PCN reaction that required hospitalization: No Has patient had a PCN reaction occurring within the last 10 years: No If all of the above answers are "NO", then may proceed with Cephalosporin use.     Medications Scheduled Medications: . aspirin  325 mg Oral Daily  . carvedilol  6.25 mg Oral BID WC  . enoxaparin  (LOVENOX) injection  40 mg Subcutaneous Q24H  . furosemide  20 mg Intravenous Q12H  . losartan  25 mg Oral Daily  . potassium chloride SA  30 mEq Oral Daily  . sodium chloride flush  3 mL Intravenous Q12H    PRN Medications: sodium chloride, acetaminophen **OR** acetaminophen, ondansetron **OR** ondansetron (ZOFRAN) IV, sodium chloride flush   Past Medical History:  Diagnosis Date  . CKD (chronic kidney disease) stage 3, GFR 30-59 ml/min   . Essential hypertension   . History of cardiac catheterization    Normal coronaries 05/2014  . Nonischemic cardiomyopathy (HCC)   . NSVT (nonsustained ventricular tachycardia) (HCC)     Past Surgical History:  Procedure Laterality Date  . HYDROCELE EXCISION    . KNEE SURGERY Right 2013  . LEFT HEART CATHETERIZATION WITH CORONARY ANGIOGRAM N/A 06/21/2014   Procedure: LEFT HEART CATHETERIZATION WITH CORONARY ANGIOGRAM;  Surgeon: Kathleene Hazel, MD;  Location: [facility] CATH LAB;  Service: Cardiovascular;  Laterality: N/A;  . VASECTOMY      Family History  Problem Relation Age of Onset  . Stroke Mother   . Heart disease      Social History Mr. Clarkin reports that he has been smoking Cigars and Cigarettes.  He has a 46.00 pack-year smoking history. He uses smokeless tobacco. Mr. Coriell reports that he does not drink alcohol.  Review of Systems Complete review of systems negative except as otherwise outlined in the clinical summary and also the following. Fatigue. Abdominal fullness.  Physical Examination Blood pressure (!) 130/96, pulse 70, temperature 97.3 F (36.3 C), temperature source Oral, resp. rate 19, height 5\' 9"  (1.753 m), weight 177 lb 7.5 oz (80.5 kg), SpO2 98 %.  Intake/Output Summary (Last 24 hours) at 03/12/16 0913 Last data filed at 03/12/16 0848  Gross per 24 hour  Intake              320 ml  Output             2100 ml  Net            -1780 ml   Telemetry: Sinus rhythm. Brief episodes NSVT noted.  Gen.: No  distress. HEENT: Conjunctiva and lids normal, oropharynx clear. Neck: Supple, JVP 10, no carotid bruits, no thyromegaly. Lungs: Few faint basilar crackles, nonlabored breathing at rest. Cardiac: Regular rate and rhythm, no S3 or significant systolic murmur, no pericardial rub. Abdomen: Soft, nontender, bowel sounds present, no guarding or rebound. Extremities: No pitting edema, distal pulses 2+. Skin: Warm and dry. Musculoskeletal: No kyphosis. Neuropsychiatric: Alert and oriented x3, affect grossly appropriate.  Lab Results  Basic Metabolic Panel:  Recent Labs Lab 03/07/16 2302 03/08/16 0537 03/09/16 0309 03/09/16 0929  03/10/16 0408 03/11/16 0238 03/11/16 2142  NA 138  --  141  --   --  142 140 140  K 3.2*  --  2.9*  --   < > 3.2* 3.1* 4.8  CL 106  --  106  --   --  103 104 106  CO2 25  --  27  --   --  30 27 25   GLUCOSE 128*  --  114*  --   --  100* 101* 306*  BUN 24*  --  31*  --   --  26* 24* 32*  CREATININE 1.58* 1.48* 1.62*  --   --  1.60* 1.59* 1.66*  CALCIUM 8.5*  --  8.6*  --   --  8.1* 8.3* 8.6*  MG  --   --   --  1.8  --   --   --   --   < > = values in this interval not displayed.  Liver Function Tests:  Recent Labs Lab 03/07/16 2302 03/11/16 2142  AST 71* 71*  ALT 46 52  ALKPHOS 83 111  BILITOT 0.4 0.3  PROT 6.3* 6.7  ALBUMIN 3.1* 3.4*    CBC:  Recent Labs Lab 03/07/16 2302 03/08/16 0537 03/11/16 2142  WBC 12.4* 6.7 10.2  NEUTROABS 10.8*  --  4.4  HGB 13.3 13.9 14.7  HCT 37.9* 39.9 43.6  MCV 95.2 94.5 98.9  PLT 179 181 220    Cardiac Enzymes:  Recent Labs Lab 03/08/16 1107 03/08/16 1558 03/11/16 2142 03/12/16 0126 03/12/16 0733  TROPONINI 0.21* 0.19* 0.08* 0.22* 0.26*    BNP: 1499  ECG I personally reviewed the tracing from 03/11/2016 which showed sinus tachycardia with left atrial enlargement, LVH, and repolarization abnormalities.  Imaging  Chest x-ray 03/11/2016: FINDINGS: Cardiac enlargement with pulmonary  vascular congestion. Bilateral interstitial and perihilar alveolar infiltration. Infiltrates are progressing since previous study and likely indicating edema. Bilateral pneumonia could also have this appearance. Probable small left pleural effusion. No pneumothorax. Calcification and torsion of the aorta.  IMPRESSION: Progressing changes of congestive heart failure since previous study with developing interstitial and alveolar edema. Small left pleural effusion.  Echocardiogram 03/09/2016: Study Conclusions  - Left ventricle: The cavity size was normal. Wall thickness was   normal. Systolic function was moderately to severely reduced. The   estimated ejection fraction was in the range of 30% to 35%.   Features are consistent with a pseudonormal left ventricular   filling pattern, with concomitant  abnormal relaxation and   increased filling pressure (grade 2 diastolic dysfunction). - Aortic valve: There was mild regurgitation. - Left atrium: The atrium was mildly dilated.  Impression  1. Acute systolic heart failure with history of nonischemic cardiomyopathy. LVEF 30-35% range by recent echocardiogram with associated grade 2 diastolic dysfunction. This is complicated by medication noncompliance and uncontrolled hypertension.  2. Uncontrolled hypertension.  3. CKD, stage 3, creatinine 1.6.  4. History of NSVT, asymptomatic.  4. Normal coronary arteries at cardiac catheterization February 2016.  Recommendations  Continue IV diuresis, had approximately 2100 cc out more than in last 24 hours and symptomatically feeling better. Creatinine is stable so far, losartan was most recent addition to his regimen. Blood pressure still not optimally controlled so will likely need further medication adjustments as tolerated. Whether ARB can be pushed higher is not yet certain, may need to consider hydralazine which he had been taking in the past. Otherwise continue carvedilol and potassium  supplements. Transfer out to telemetry.  Jonelle SidleSamuel G. Lorilyn Laitinen, M.D., F.A.C.C.

## 2016-03-13 ENCOUNTER — Inpatient Hospital Stay (HOSPITAL_COMMUNITY): Payer: Medicare Other

## 2016-03-13 DIAGNOSIS — I428 Other cardiomyopathies: Secondary | ICD-10-CM

## 2016-03-13 LAB — BASIC METABOLIC PANEL
ANION GAP: 7 (ref 5–15)
BUN: 29 mg/dL — AB (ref 6–20)
CO2: 28 mmol/L (ref 22–32)
Calcium: 8.4 mg/dL — ABNORMAL LOW (ref 8.9–10.3)
Chloride: 104 mmol/L (ref 101–111)
Creatinine, Ser: 1.4 mg/dL — ABNORMAL HIGH (ref 0.61–1.24)
GFR calc Af Amer: 59 mL/min — ABNORMAL LOW (ref >60)
GFR calc non Af Amer: 51 mL/min — ABNORMAL LOW (ref >60)
GLUCOSE: 97 mg/dL (ref 65–99)
POTASSIUM: 2.9 mmol/L — AB (ref 3.5–5.1)
Sodium: 139 mmol/L (ref 135–145)

## 2016-03-13 LAB — MAGNESIUM: Magnesium: 1.8 mg/dL (ref 1.7–2.4)

## 2016-03-13 MED ORDER — FUROSEMIDE 40 MG PO TABS
40.0000 mg | ORAL_TABLET | Freq: Two times a day (BID) | ORAL | Status: DC
  Administered 2016-03-13: 40 mg via ORAL
  Filled 2016-03-13: qty 1

## 2016-03-13 MED ORDER — POTASSIUM CHLORIDE CRYS ER 20 MEQ PO TBCR
40.0000 meq | EXTENDED_RELEASE_TABLET | Freq: Two times a day (BID) | ORAL | Status: AC
  Administered 2016-03-13 – 2016-03-14 (×2): 40 meq via ORAL
  Filled 2016-03-13 (×2): qty 2

## 2016-03-13 MED ORDER — POTASSIUM CHLORIDE 10 MEQ/100ML IV SOLN
10.0000 meq | INTRAVENOUS | Status: AC
  Administered 2016-03-13 (×3): 10 meq via INTRAVENOUS
  Filled 2016-03-13: qty 100

## 2016-03-13 MED ORDER — MAGNESIUM SULFATE 2 GM/50ML IV SOLN
2.0000 g | Freq: Once | INTRAVENOUS | Status: AC
  Administered 2016-03-13: 2 g via INTRAVENOUS
  Filled 2016-03-13: qty 50

## 2016-03-13 NOTE — Progress Notes (Signed)
Pt up sitting in recliner. Vital signs stable. IV patent. Report given to L.Earlene Plater, Charity fundraiser. Pt to be transferred to room 314 via wheelchair.

## 2016-03-13 NOTE — Progress Notes (Signed)
Patient Name: Wesley Riley Date of Encounter: 03/13/2016  Primary Cardiologist: Dr. Darliss Ridgel [facility] Problem List     Principal Problem:   Acute on chronic combined systolic and diastolic heart failure (HCC) Active Problems:   Tobacco abuse   Elevated troponin   Nonischemic cardiomyopathy (HCC)   Acute respiratory failure with hypoxia (HCC)   Flash pulmonary edema (HCC)   CKD (chronic kidney disease) stage 3, GFR 30-59 ml/min    Subjective   Feels better today. Eating breakfast. No chest pain. Still intermittent cough.  Inpatient Medications    Scheduled Meds: . aspirin  325 mg Oral Daily  . carvedilol  6.25 mg Oral BID WC  . enoxaparin (LOVENOX) injection  40 mg Subcutaneous Q24H  . furosemide  40 mg Intravenous BID  . isosorbide-hydrALAZINE  1 tablet Oral TID  . losartan  12.5 mg Oral Daily  . potassium chloride SA  30 mEq Oral Daily    PRN Meds: acetaminophen **OR** [DISCONTINUED] acetaminophen, ondansetron **OR** ondansetron (ZOFRAN) IV   Vital Signs    Vitals:   03/13/16 0400 03/13/16 0700 03/13/16 0713 03/13/16 0803  BP: 126/86 129/81  109/89  Pulse: 86 91 75 77  Resp: 17 (!) 29 18 20   Temp: 97.3 F (36.3 C)  (!) 96.9 F (36.1 C)   TempSrc: Oral  Oral   SpO2: 96% (!) 86% 90% 91%  Weight: 177 lb 4 oz (80.4 kg)     Height:        Intake/Output Summary (Last 24 hours) at 03/13/16 0814 Last data filed at 03/13/16 0804  Gross per 24 hour  Intake              800 ml  Output             2650 ml  Net            -1850 ml   Filed Weights   03/12/16 0023 03/12/16 0500 03/13/16 0400  Weight: 177 lb 7.5 oz (80.5 kg) 177 lb 7.5 oz (80.5 kg) 177 lb 4 oz (80.4 kg)    Physical Exam    Gen: Appears comfortable at rest. HEENT: Conjunctiva and lids normal, oropharynx clear. Neck: Supple, no elevated JVP. Lungs: Clear to auscultation, nonlabored breathing at rest. Cardiac: Regular rate and rhythm, no S3 or significant systolic murmur, no  pericardial rub. Abdomen: Soft, nontender, no bruits. Extremities: No pitting edema, distal pulses 2+.  Labs    CBC  Recent Labs  03/11/16 2142  WBC 10.2  NEUTROABS 4.4  HGB 14.7  HCT 43.6  MCV 98.9  PLT 220   Basic Metabolic Panel  Recent Labs  03/11/16 2142 03/13/16 0358  NA 140 139  K 4.8 2.9*  CL 106 104  CO2 25 28  GLUCOSE 306* 97  BUN 32* 29*  CREATININE 1.66* 1.40*  CALCIUM 8.6* 8.4*  MG  --  1.8   Liver Function Tests  Recent Labs  03/11/16 2142  AST 71*  ALT 52  ALKPHOS 111  BILITOT 0.3  PROT 6.7  ALBUMIN 3.4*   Cardiac Enzymes  Recent Labs  03/12/16 0126 03/12/16 0733 03/12/16 1309  TROPONINI 0.22* 0.26* 0.17*    Telemetry    I personally reviewed telemetry monitoring which shows sinus rhythm, brief run NSVT.  Radiology    Mr [name] Abdomen Wo Contrast  Result Date: 03/12/2016 CLINICAL DATA:  Hypertensive crisis and flash pulmonary edema. EXAM: MRA ABDOMEN WITHOUT CONTRAST TECHNIQUE: Multiplanar, multiecho pulse sequences  of the abdomen were obtained without intravenous contrast. Angiographic images of abdomen were obtained using MRA technique without intravenous contrast. CONTRAST:  None COMPARISON:  None. FINDINGS: MRA ABDOMEN FINDINGS Severe motion artifact limits the examination. The renal arteries are poorly visualized and cannot be assessed by this study. Renal artery stenosis cannot be excluded by this study. The aorta is non aneurysmal. Gallbladder, adrenal glands, spleen, and pancreas are grossly unremarkable. Tiny cysts in the kidneys are noted. There is a 1.5 cm T2 hyperintense lesion in the right lobe of the liver which is nonspecific. IMPRESSION: MRA ABDOMEN IMPRESSION Renal arteries are poorly visualized and cannot be evaluated on this study. Renal artery stenosis cannot be excluded 1.5 cm indeterminate lesion in the liver. Three phase liver MR is recommended. Electronically Signed   By: Jolaine ClickArthur  Hoss M.D.   On: 03/12/2016 13:03    Dg Chest Portable 1 View  Result Date: 03/11/2016 CLINICAL DATA:  Shortness of breath and diaphoresis. History of congestive heart failure, hypertension, and smoker. EXAM: PORTABLE CHEST 1 VIEW COMPARISON:  03/07/2016 FINDINGS: Cardiac enlargement with pulmonary vascular congestion. Bilateral interstitial and perihilar alveolar infiltration. Infiltrates are progressing since previous study and likely indicating edema. Bilateral pneumonia could also have this appearance. Probable small left pleural effusion. No pneumothorax. Calcification and torsion of the aorta. IMPRESSION: Progressing changes of congestive heart failure since previous study with developing interstitial and alveolar edema. Small left pleural effusion. Electronically Signed   By: Burman NievesWilliam  Stevens M.D.   On: 03/11/2016 21:58    Cardiac Studies   Echocardiogram 03/09/2016: Study Conclusions  - Left ventricle: The cavity size was normal. Wall thickness was   normal. Systolic function was moderately to severely reduced. The   estimated ejection fraction was in the range of 30% to 35%.   Features are consistent with a pseudonormal left ventricular   filling pattern, with concomitant abnormal relaxation and   increased filling pressure (grade 2 diastolic dysfunction). - Aortic valve: There was mild regurgitation. - Left atrium: The atrium was mildly dilated.  Patient Profile     65 year old male with nonischemic cardiomyopathy, LVEF 30-35%, grade 2 diastolic dysfunction, CKD stage 3, uncontrolled hypertension, and preceding medication noncompliance now presenting with acute combined heart failure. Mildly elevated troponin I consistent with CHF rather than ACS. Clinically improving with medication adjustments including IV diuresis.  Assessment & Plan    1. Acute combined heart failure with history of nonischemic cardiomyopathy. LVEF 30-35% range by recent echocardiogram with associated grade 2 diastolic dysfunction.  Clinically improving with diuresis, approximately 3700 cc out more than in. Hypokalemic with diuresis.  2. Uncontrolled hypertension. Blood pressure better controlled. Now on Coreg, Losartan, and Bidil. Agree with recent assessment for possible RAS although MRA was not diagnostic.  3. CKD, stage 3, creatinine down to 1.4.  4. History of NSVT, asymptomatic.  4. Normal coronary arteries at cardiac catheterization February 2016.  Would transfer to telemetry and increase activity. Continue Coreg, Losartan, Bidil, and change Lasix to 40 mg PO BID. Replete potassium. Possible discharge within 24 hours. Can consider outpatient renal artery duplex scan since MRA was not diagnostic. He will need close outpatient follow-up within a week.  Signed, Jonelle SidleSamuel G. Adryan Shin, M.D., F.A.C.C.  03/13/2016, 8:14 AM

## 2016-03-13 NOTE — Telephone Encounter (Signed)
LM to call back.

## 2016-03-13 NOTE — Progress Notes (Signed)
PROGRESS NOTE                                                                                                                                                                                                             Patient Demographics:    Wesley Riley, is a 65 y.o. male, DOB - Mar 27, 1951, ZOX:[id number]  Admit date - 03/11/2016   Admitting Physician Meredeth Ide, MD  Outpatient Primary MD for the patient is [name], DO  LOS - 1  Chief Complaint  Patient presents with  . Respiratory Distress       Brief Narrative      Wesley Riley  is a 65 y.o. male, With history of nonischemic cardiopathy, chronic systolic CHF EF 30-35%, grade 2 diastolic dysfunction, was just discharged from [facility]. Patient says he went home he was initially fine but started having shortness of breath around 8 PM. Phonating new complaint was cough when he went home. He denies chest pain, nausea or vomiting. When shortness of breath became worse he came to the ED.  In the ED initially he required BiPAP, started nitroglycerin drip & was Since admission he has diuresed well and shortness of breath has resolved, cardiology following. Likely discharge in 1-2 days.    Subjective:    Wesley Riley today has, No headache, No chest pain, No abdominal pain - No Nausea, No new weakness tingling or numbness, No Cough - Improved SOB.    Assessment  & Plan :    1.Acute hypoxic respiratory failure due to acute on chronic combined systolic and diastolic CHF EF 81%. Initially required BiPAP and nitro drip, now improved and on nasal cannula, continue diuresis, continue Coreg, continue ARB, have added BiDil, cardiology on board. Since he was recently admitted and discharged earlier on the day of admission from [facility] and then developed flash pulmonary edema suddenly we tried to rule out renal artery stenosis of her MRA of the renal arteries was  inconclusive, ultrasound of renal arteries is not done in this facility and this should be pursued in the outpatient setting.  Improved from CHF standpoint, will be monitored out of stepdown, increase activity, titrated off oxygen, continue diuretics with oral Lasix, continue Coreg, ARB and BiDil. Likely discharge in the morning.  2. HX of hypertensive crisis, now flash pulmonary edema. 2 rule  out renal artery stenosis however the renal artery MRA was inconclusive, renal artery ultrasound is not done in this facility, this should be pursued in the outpatient setting. Currently stable from this standpoint.   3. CK D stage IV. Baseline creatinine around 1.5-1.6. Option stable with diuresis continue to monitor.  4. Mildly elevated troponin. In non-ACS pattern, EKG stable, he is chest pain-free, likely mild demand ischemia from #1 above. Follow trend. On aspirin, beta blocker which will be continued. Cardiology on board.  5. Hypertension. Stable on Coreg, ARB and BiDil. Have reduced ARB dose to provide room for BiDil, also his potassium was top normal.  6. Hypokalemia. Replace and monitor with magnesium tomorrow.    Family Communication  :  wife  Code Status :  Full  Diet : Diet Heart Room service appropriate? Yes; Fluid consistency: Thin; Fluid restriction: 1500 mL Fluid    Disposition Plan  :  Tele  Consults  :  cards  Procedures  :    MRA Renal Arteries - non conclusive  DVT Prophylaxis  :  Lovenox   Lab Results  Component Value Date   PLT 220 03/11/2016    Inpatient Medications  Scheduled Meds: . aspirin  325 mg Oral Daily  . carvedilol  6.25 mg Oral BID WC  . enoxaparin (LOVENOX) injection  40 mg Subcutaneous Q24H  . furosemide  40 mg Oral BID  . isosorbide-hydrALAZINE  1 tablet Oral TID  . losartan  12.5 mg Oral Daily  . magnesium sulfate 1 - 4 g bolus IVPB  2 g Intravenous Once  . potassium chloride  10 mEq Intravenous Q1 Hr x 3  . potassium chloride SA  40 mEq Oral  BID   Continuous Infusions: PRN Meds:.acetaminophen **OR** [DISCONTINUED] acetaminophen, ondansetron **OR** ondansetron (ZOFRAN) IV  Antibiotics  :    Anti-infectives    None         Objective:   Vitals:   03/13/16 0400 03/13/16 0700 03/13/16 0713 03/13/16 0803  BP: 126/86 129/81  109/89  Pulse: 86 91 75 77  Resp: 17 (!) 29 18 20   Temp: 97.3 F (36.3 C)  (!) 96.9 F (36.1 C)   TempSrc: Oral  Oral   SpO2: 96% (!) 86% 90% 91%  Weight: 80.4 kg (177 lb 4 oz)     Height:        Wt Readings from Last 3 Encounters:  03/13/16 80.4 kg (177 lb 4 oz)  03/11/16 80.7 kg (178 lb)  03/21/15 85.7 kg (189 lb)     Intake/Output Summary (Last 24 hours) at 03/13/16 0902 Last data filed at 03/13/16 0825  Gross per 24 hour  Intake              960 ml  Output             2450 ml  Net            -1490 ml     Physical Exam  Awake Alert, Oriented X 3, No new F.N deficits, Normal affect .AT,PERRAL Supple Neck,No JVD, No cervical lymphadenopathy appriciated.  Symmetrical Chest wall movement, Good air movement bilaterally, few rales RRR,No Gallops,Rubs or new Murmurs, No Parasternal Heave +ve B.Sounds, Abd Soft, No tenderness, No organomegaly appriciated, No rebound - guarding or rigidity. No Cyanosis, Clubbing or edema, No new Rash or bruise      Data Review:    CBC  Recent Labs Lab 03/07/16 2302 03/08/16 0537 03/11/16 2142  WBC 12.4* 6.7 10.2  HGB 13.3 13.9 14.7  HCT 37.9* 39.9 43.6  PLT 179 181 220  MCV 95.2 94.5 98.9  MCH 33.4 32.9 33.3  MCHC 35.1 34.8 33.7  RDW 13.6 13.6 13.5  LYMPHSABS 1.0  --  4.7*  MONOABS 0.5  --  0.8  EOSABS 0.1  --  0.3  BASOSABS 0.0  --  0.1    Chemistries   Recent Labs Lab 03/07/16 2302  03/09/16 0309 03/09/16 0929 03/09/16 1545 03/10/16 0408 03/11/16 0238 03/11/16 2142 03/13/16 0358  NA 138  --  141  --   --  142 140 140 139  K 3.2*  --  2.9*  --  3.7 3.2* 3.1* 4.8 2.9*  CL 106  --  106  --   --  103 104 106 104  CO2  25  --  27  --   --  30 27 25 28   GLUCOSE 128*  --  114*  --   --  100* 101* 306* 97  BUN 24*  --  31*  --   --  26* 24* 32* 29*  CREATININE 1.58*  < > 1.62*  --   --  1.60* 1.59* 1.66* 1.40*  CALCIUM 8.5*  --  8.6*  --   --  8.1* 8.3* 8.6* 8.4*  MG  --   --   --  1.8  --   --   --   --  1.8  AST 71*  --   --   --   --   --   --  71*  --   ALT 46  --   --   --   --   --   --  52  --   ALKPHOS 83  --   --   --   --   --   --  111  --   BILITOT 0.4  --   --   --   --   --   --  0.3  --   < > = values in this interval not displayed. ------------------------------------------------------------------------------------------------------------------ No results for input(s): CHOL, HDL, LDLCALC, TRIG, CHOLHDL, LDLDIRECT in the last 72 hours.  Lab Results  Component Value Date   HGBA1C 5.8 (H) 06/20/2014   ------------------------------------------------------------------------------------------------------------------ No results for input(s): TSH, T4TOTAL, T3FREE, THYROIDAB in the last 72 hours.  Invalid input(s): FREET3 ------------------------------------------------------------------------------------------------------------------ No results for input(s): VITAMINB12, FOLATE, FERRITIN, TIBC, IRON, RETICCTPCT in the last 72 hours.  Coagulation profile No results for input(s): INR, PROTIME in the last 168 hours.  No results for input(s): DDIMER in the last 72 hours.  Cardiac Enzymes  Recent Labs Lab 03/12/16 0126 03/12/16 0733 03/12/16 1309  TROPONINI 0.22* 0.26* 0.17*   ------------------------------------------------------------------------------------------------------------------    Component Value Date/Time   BNP 1,499.0 (H) 03/11/2016 2142    Micro Results Recent Results (from the past 240 hour(s))  MRSA PCR Screening     Status: None   Collection Time: 03/12/16 12:15 AM  Result Value Ref Range Status   MRSA by PCR NEGATIVE NEGATIVE Final    Comment:        The  GeneXpert MRSA Assay (FDA approved for NASAL specimens only), is one component of a comprehensive MRSA colonization surveillance program. It is not intended to diagnose MRSA infection nor to guide or monitor treatment for MRSA infections.     Radiology Reports Dg Chest 2 View  Result Date: 03/07/2016 CLINICAL DATA:  Shortness of breath and cough.  History of CHF.  EXAM: CHEST  2 VIEW COMPARISON:  Chest radiograph June 21, 2014 FINDINGS: Cardiac silhouette is moderately enlarged, similar. Mildly tortuous atherosclerotic aorta. Pulmonary vascular congestion mild interstitial prominence with LEFT lung base strandy densities. No pleural effusion or focal consolidation here lead no pneumothorax. Soft tissue planes and included osseous structure nonsuspicious. IMPRESSION: Cardiomegaly, pulmonary vascular congestion and mild interstitial prominence concerning for pulmonary edema. Bibasilar atelectasis. Electronically Signed   By: Awilda Metroourtnay  Bloomer M.D.   On: 03/07/2016 23:05   Mr Maxine GlennMra Abdomen Wo Contrast  Result Date: 03/12/2016 CLINICAL DATA:  Hypertensive crisis and flash pulmonary edema. EXAM: MRA ABDOMEN WITHOUT CONTRAST TECHNIQUE: Multiplanar, multiecho pulse sequences of the abdomen were obtained without intravenous contrast. Angiographic images of abdomen were obtained using MRA technique without intravenous contrast. CONTRAST:  None COMPARISON:  None. FINDINGS: MRA ABDOMEN FINDINGS Severe motion artifact limits the examination. The renal arteries are poorly visualized and cannot be assessed by this study. Renal artery stenosis cannot be excluded by this study. The aorta is non aneurysmal. Gallbladder, adrenal glands, spleen, and pancreas are grossly unremarkable. Tiny cysts in the kidneys are noted. There is a 1.5 cm T2 hyperintense lesion in the right lobe of the liver which is nonspecific. IMPRESSION: MRA ABDOMEN IMPRESSION Renal arteries are poorly visualized and cannot be evaluated on  this study. Renal artery stenosis cannot be excluded 1.5 cm indeterminate lesion in the liver. Three phase liver MR is recommended. Electronically Signed   By: Jolaine ClickArthur  Hoss M.D.   On: 03/12/2016 13:03   Dg Chest Portable 1 View  Result Date: 03/11/2016 CLINICAL DATA:  Shortness of breath and diaphoresis. History of congestive heart failure, hypertension, and smoker. EXAM: PORTABLE CHEST 1 VIEW COMPARISON:  03/07/2016 FINDINGS: Cardiac enlargement with pulmonary vascular congestion. Bilateral interstitial and perihilar alveolar infiltration. Infiltrates are progressing since previous study and likely indicating edema. Bilateral pneumonia could also have this appearance. Probable small left pleural effusion. No pneumothorax. Calcification and torsion of the aorta. IMPRESSION: Progressing changes of congestive heart failure since previous study with developing interstitial and alveolar edema. Small left pleural effusion. Electronically Signed   By: Burman NievesWilliam  Stevens M.D.   On: 03/11/2016 21:58    Time Spent in minutes  30   SINGH,PRASHANT K M.D on 03/13/2016 at 9:02 AM  Between 7am to 7pm - Pager - [phone]  After 7pm go to [email or web address] - password [email or web address]  [facility] -  Office  [phone]

## 2016-03-13 NOTE — Progress Notes (Signed)
Pt. Received from ICU.Wife at bedside. No complaits.

## 2016-03-14 ENCOUNTER — Telehealth: Payer: Self-pay | Admitting: Cardiovascular Disease

## 2016-03-14 ENCOUNTER — Inpatient Hospital Stay (HOSPITAL_COMMUNITY): Payer: Medicare Other

## 2016-03-14 DIAGNOSIS — R748 Abnormal levels of other serum enzymes: Secondary | ICD-10-CM

## 2016-03-14 DIAGNOSIS — J9601 Acute respiratory failure with hypoxia: Secondary | ICD-10-CM

## 2016-03-14 DIAGNOSIS — N183 Chronic kidney disease, stage 3 (moderate): Secondary | ICD-10-CM

## 2016-03-14 LAB — BASIC METABOLIC PANEL
Anion gap: 5 (ref 5–15)
BUN: 27 mg/dL — ABNORMAL HIGH (ref 6–20)
CHLORIDE: 105 mmol/L (ref 101–111)
CO2: 29 mmol/L (ref 22–32)
Calcium: 8.4 mg/dL — ABNORMAL LOW (ref 8.9–10.3)
Creatinine, Ser: 1.71 mg/dL — ABNORMAL HIGH (ref 0.61–1.24)
GFR calc non Af Amer: 40 mL/min — ABNORMAL LOW (ref >60)
GFR, EST AFRICAN AMERICAN: 47 mL/min — AB (ref >60)
Glucose, Bld: 101 mg/dL — ABNORMAL HIGH (ref 65–99)
POTASSIUM: 3.8 mmol/L (ref 3.5–5.1)
Sodium: 139 mmol/L (ref 135–145)

## 2016-03-14 LAB — MAGNESIUM: Magnesium: 2.1 mg/dL (ref 1.7–2.4)

## 2016-03-14 MED ORDER — HYDRALAZINE HCL 25 MG PO TABS: 25.0000 mg | ORAL_TABLET | Freq: Three times a day (TID) | ORAL | 0 refills | Status: DC

## 2016-03-14 MED ORDER — LOSARTAN POTASSIUM 25 MG PO TABS: 12.5000 mg | ORAL_TABLET | Freq: Every day | ORAL | 1 refills | Status: DC

## 2016-03-14 MED ORDER — POTASSIUM CHLORIDE CRYS ER 20 MEQ PO TBCR: 20.0000 meq | EXTENDED_RELEASE_TABLET | Freq: Every day | ORAL | 0 refills | Status: DC

## 2016-03-14 MED ORDER — FUROSEMIDE 20 MG PO TABS: 60.0000 mg | ORAL_TABLET | Freq: Every day | ORAL | Status: DC

## 2016-03-14 MED ORDER — ISOSORBIDE DINITRATE 10 MG PO TABS: 10.0000 mg | ORAL_TABLET | Freq: Three times a day (TID) | ORAL | 3 refills | Status: DC

## 2016-03-14 MED ORDER — ISOSORB DINITRATE-HYDRALAZINE 20-37.5 MG PO TABS: 1.0000 | ORAL_TABLET | Freq: Three times a day (TID) | ORAL | 1 refills | Status: DC

## 2016-03-14 MED ORDER — FUROSEMIDE 20 MG PO TABS: 60.0000 mg | ORAL_TABLET | Freq: Every day | ORAL | 1 refills | Status: DC

## 2016-03-14 MED ORDER — POTASSIUM CHLORIDE CRYS ER 20 MEQ PO TBCR
20.0000 meq | EXTENDED_RELEASE_TABLET | Freq: Every day | ORAL | Status: DC
  Administered 2016-03-14: 20 meq via ORAL
  Filled 2016-03-14: qty 1

## 2016-03-14 NOTE — Care Management Note (Signed)
Case Management Note  Patient Details  Name: Wesley Riley MRN: [id number] Date of Birth: 31-Mar-1951  Expected Discharge Date:       03/14/2016           Expected Discharge Plan:  Home/Self Care  In-House Referral:  NA  Discharge planning Services  CM Consult  Post Acute Care Choice:  NA Choice offered to:  NA  Status of Service:  Completed, signed off   Additional Comments: Anticipate DC home today with self care. Pt will follow closely with cardiology. Pt was successfully weaned from oxygen and will not require it at DC.   Malcolm Metro, RN 03/14/2016, 1:09 PM

## 2016-03-14 NOTE — Telephone Encounter (Signed)
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 And he changed bidil to hydralazine 25 mg - three times daily , isosorbide 10 mg - three times daily. [facility] [place]

## 2016-03-14 NOTE — Progress Notes (Signed)
Patient discharged home.  IVs removed - WNL.  Reviewed DC instructions, medications and follow up appointments.  Educated on HF using teachback.  Patient verbalizes understanding of instructions and HF management at home.  No questions at this time.  Assisted off unit via WC in NAD

## 2016-03-14 NOTE — Progress Notes (Signed)
2051 tele called pt had 4 beats vtach. Dr schorr aware no new orders.

## 2016-03-14 NOTE — Care Management Important Message (Signed)
Important Message  Patient Details  Name: Wesley Riley MRN: [id number] Date of Birth: 1951/02/21   Medicare Important Message Given:  Yes    Malcolm Metro, RN 03/14/2016, 1:08 PM

## 2016-03-14 NOTE — Discharge Summary (Signed)
Physician Discharge Summary  MELBA VISAYA ZOX:[id number] DOB: 10/30/1950 DOA: 03/11/2016  PCP: [name], DO  Admit date: 03/11/2016 Discharge date: 03/14/2016  Admitted From: Home.  Disposition:  Home.   Recommendations for Outpatient Follow-up:  1. Follow up with PCP in 1-2 weeks 2. Please obtain BMP/CBC in one week 3. Has appointment with Cardiologist Dr Baldemar Friday on Nov 28th, 2017  Home Health: No Equipment/Devices: None.  Discharge Condition: No SOB, mild non productive cough.  CODE STATUS: FULL CODE.  Diet recommendation: NAS, cardiac diet.   Brief/Interim Summary:  Patient was admitted into the hospital for SOB by Dr Sharl Ma on Mar 11, 2016.  As per his H and P:  "  Evert Gravelle  is a 65 y.o. male, With history of nonischemic cardiopathy, chronic systolic CHF EF 30-35%, grade 2 diastolic dysfunction, was just discharged from [facility]. Patient says he went home he was initially fine but started having shortness of breath around 8 PM. Phonating new complaint was cough when he went home. He denies chest pain, nausea or vomiting. When shortness of breath became worse he came to the ED. In the ED initially he required BiPAP, started nitroglycerin drip, given IV Lasix 60 mg 1.  Patient is now off BiPAP, currently on 6 L oxygen via nasal cannula. Breathing is better.  Hospital Course:  Patient was admitted for acute on chronic systolic CHF, along with diastolic CHF, and was diuresed.  He was initially required Bipap and nitro drip, and was continued on Coreg, BB, ARB and subsequently Bidil was added.  Cardiology has helped in consultation, and after adequate diuresis, felt that he is now ready for discharge.  He does have a non productive coughs, but no fever, and a follow up CXR showed atelectasis of the right lobe, but no evidence of PNA or infiltrate.  He has CKD, and his Cr was 1.7, so his Lasix was adjusted down from 40mg  BID to 60mg  QD.  He has an appointment to see  Dr Lavera Guise on 03/20/2016. He will see his PCP next week as well.  He is stable for discharge, and will be discharged home today.  Thank you and Good Day.   Discharge Diagnoses:  Principal Problem:   Acute on chronic combined systolic and diastolic heart failure (HCC) Active Problems:   Tobacco abuse   Elevated troponin   Nonischemic cardiomyopathy (HCC)   Acute respiratory failure with hypoxia (HCC)   Flash pulmonary edema (HCC)   CKD (chronic kidney disease) stage 3, GFR 30-59 ml/min    Discharge Instructions  Discharge Instructions    Diet - low sodium heart healthy    Complete by:  As directed    Discharge instructions    Complete by:  As directed    Follow up with your PCP next week. Follow up with your cardiology Dr Lavera Guise Nov 28th, as scheduled.   Increase activity slowly    Complete by:  As directed        Medication List    TAKE these medications   aspirin 325 MG EC tablet Take 1 tablet (325 mg total) by mouth daily.   carvedilol 6.25 MG tablet Commonly known as:  COREG Take 6.25 mg by mouth 2 (two) times daily with a meal.   furosemide 20 MG tablet Commonly known as:  LASIX Take 3 tablets (60 mg total) by mouth daily. Start taking on:  03/15/2016 What changed:  medication strength  how much to take  when to take  this   isosorbide-hydrALAZINE 20-37.5 MG tablet Commonly known as:  BIDIL Take 1 tablet by mouth 3 (three) times daily.   losartan 25 MG tablet Commonly known as:  COZAAR Take 0.5 tablets (12.5 mg total) by mouth daily. Start taking on:  03/15/2016 What changed:  how much to take   NON FORMULARY Electromagnetic (all-body) beamer/stimulator: Lay on mat 8-12 minutes (per session) twice a day to stimulate vagus nerve activity   OVER THE COUNTER MEDICATION Colloidal Silver: Take 30 ml's by mouth once a day   OVER THE COUNTER MEDICATION Essential oils: One drop of peppermint oil applied to the tongue as needed for coughing    potassium chloride SA 20 MEQ tablet Commonly known as:  K-DUR,KLOR-CON Take 1 tablet (20 mEq total) by mouth daily. Start taking on:  03/15/2016 What changed:  medication strength  how much to take       Allergies  Allergen Reactions  . Penicillins Hives and Rash    Has patient had a PCN reaction causing immediate rash, facial/tongue/throat swelling, SOB or lightheadedness with hypotension: Yes Has patient had a PCN reaction causing severe rash involving mucus membranes or skin necrosis: No Has patient had a PCN reaction that required hospitalization: No Has patient had a PCN reaction occurring within the last 10 years: No If all of the above answers are "NO", then may proceed with Cephalosporin use.     Consultations:  Cardiology:  Dr Diona Browner.  Procedures/Studies:  None.   Subjective: Feeling better.  Has a nonproductive cough.   Discharge Exam: Vitals:   03/13/16 2200 03/14/16 0543  BP: (!) 138/91 115/68  Pulse: 66 78  Resp: 20 20  Temp:  97.1 F (36.2 C)   Vitals:   03/13/16 1100 03/13/16 1524 03/13/16 2200 03/14/16 0543  BP: 103/71 116/79 (!) 138/91 115/68  Pulse: 65 67 66 78  Resp: 18 20 20 20   Temp:  98.5 F (36.9 C)  97.1 F (36.2 C)  TempSrc:  Oral Oral Oral  SpO2: 92% 97% 98% 93%  Weight:      Height:        General: Pt is alert, awake, not in acute distress Cardiovascular: RRR, S1/S2 +, no rubs, no gallops Respiratory: CTA bilaterally, no wheezing, no rhonchi Abdominal: Soft, NT, ND, bowel sounds + Extremities: no edema, no cyanosis    The results of significant diagnostics from this hospitalization (including imaging, microbiology, ancillary and laboratory) are listed below for reference.     Microbiology: Recent Results (from the past 240 hour(s))  MRSA PCR Screening     Status: None   Collection Time: 03/12/16 12:15 AM  Result Value Ref Range Status   MRSA by PCR NEGATIVE NEGATIVE Final    Comment:        The GeneXpert MRSA  Assay (FDA approved for NASAL specimens only), is one component of a comprehensive MRSA colonization surveillance program. It is not intended to diagnose MRSA infection nor to guide or monitor treatment for MRSA infections.      Labs: BNP (last 3 results)  Recent Labs  03/07/16 2308 03/11/16 2142  BNP 1,082.8* 1,499.0*   Basic Metabolic Panel:  Recent Labs Lab 03/09/16 0929  03/10/16 0408 03/11/16 0238 03/11/16 2142 03/13/16 0358 03/14/16 0633  NA  --   --  142 140 140 139 139  K  --   < > 3.2* 3.1* 4.8 2.9* 3.8  CL  --   --  103 104 106 104 105  CO2  --   --  30 27 25 28 29   GLUCOSE  --   --  100* 101* 306* 97 101*  BUN  --   --  26* 24* 32* 29* 27*  CREATININE  --   --  1.60* 1.59* 1.66* 1.40* 1.71*  CALCIUM  --   --  8.1* 8.3* 8.6* 8.4* 8.4*  MG 1.8  --   --   --   --  1.8 2.1  < > = values in this interval not displayed. Liver Function Tests:  Recent Labs Lab 03/07/16 2302 03/11/16 2142  AST 71* 71*  ALT 46 52  ALKPHOS 83 111  BILITOT 0.4 0.3  PROT 6.3* 6.7  ALBUMIN 3.1* 3.4*   CBC:  Recent Labs Lab 03/07/16 2302 03/08/16 0537 03/11/16 2142  WBC 12.4* 6.7 10.2  NEUTROABS 10.8*  --  4.4  HGB 13.3 13.9 14.7  HCT 37.9* 39.9 43.6  MCV 95.2 94.5 98.9  PLT 179 181 220   Cardiac Enzymes:  Recent Labs Lab 03/08/16 1558 03/11/16 2142 03/12/16 0126 03/12/16 0733 03/12/16 1309  TROPONINI 0.19* 0.08* 0.22* 0.26* 0.17*   Microbiology Recent Results (from the past 240 hour(s))  MRSA PCR Screening     Status: None   Collection Time: 03/12/16 12:15 AM  Result Value Ref Range Status   MRSA by PCR NEGATIVE NEGATIVE Final    Comment:        The GeneXpert MRSA Assay (FDA approved for NASAL specimens only), is one component of a comprehensive MRSA colonization surveillance program. It is not intended to diagnose MRSA infection nor to guide or monitor treatment for MRSA infections.     Time coordinating discharge: Over 30  minutes  SIGNED:  Houston SirenLE,Gray Maugeri, MD FACP [facility] 03/14/2016, 1:13 PM   If 7PM-7AM, please contact night-coverage [email or web address] Password [email or web address]

## 2016-03-14 NOTE — Telephone Encounter (Signed)
Spoke with pt who says he is doing well and feels much better. Confirmed appt 11/29 with Dr. [name]

## 2016-03-14 NOTE — Progress Notes (Signed)
Patient Name: Wesley GarfinkelStephen E Riley Date of Encounter: 03/14/2016  Primary Cardiologist: [facility]uresh [facility]  H[facility] Problem List     Principal Problem:   Acute on chronic combined systolic and diastolic heart failure (HCC) Active Problems:   Tobacco abuse   Elevated troponin   Nonischemic cardiomyopathy (HCC)   Acute respiratory failure with hypoxia (HCC)   Flash pulmonary edema (HCC)   CKD (chronic kidney disease) stage 3, GFR 30-59 ml/min    Subjective   Feels better, has walked around in his room. Reports some nausea at times, intermittent nonproductive cough. No orthopnea or PND.  Inpatient Medications    Scheduled Meds: . aspirin  325 mg Oral Daily  . carvedilol  6.25 mg Oral BID WC  . enoxaparin (LOVENOX) injection  40 mg Subcutaneous Q24H  . furosemide  40 mg Oral BID  . isosorbide-hydrALAZINE  1 tablet Oral TID  . losartan  12.5 mg Oral Daily    PRN Meds: acetaminophen **OR** [DISCONTINUED] acetaminophen, ondansetron **OR** ondansetron (ZOFRAN) IV   Vital Signs    Vitals:   03/13/16 1100 03/13/16 1524 03/13/16 2200 03/14/16 0543  BP: 103/71 116/79 (!) 138/91 115/68  Pulse: 65 67 66 78  Resp: 18 20 20 20   Temp:  98.5 F (36.9 C)  97.1 F (36.2 C)  TempSrc:  Oral Oral Oral  SpO2: 92% 97% 98% 93%  Weight:      Height:        Intake/Output Summary (Last 24 hours) at 03/14/16 0828 Last data filed at 03/14/16 0754  Gross per 24 hour  Intake                0 ml  Output             1725 ml  Net            -1725 ml   Filed Weights   03/12/16 0023 03/12/16 0500 03/13/16 0400  Weight: 177 lb 7.5 oz (80.5 kg) 177 lb 7.5 oz (80.5 kg) 177 lb 4 oz (80.4 kg)    Physical Exam   Gen.: Appears comfortable at rest. HEENT: Conjunctiva and lids normal, oropharynx clear. Neck: Supple, no elevated JVP or carotid bruits, no thyromegaly. Lungs: No crackles or wheezes, nonlabored breathing at rest. Cardiac: Regular rate and rhythm, no S3. Abdomen: Soft,  nontender, bowel sounds present. Extremities: No pitting edema, distal pulses 2+.  Labs    CBC  Recent Labs  03/11/16 2142  WBC 10.2  NEUTROABS 4.4  HGB 14.7  HCT 43.6  MCV 98.9  PLT 220   Basic Metabolic Panel  Recent Labs  03/13/16 0358 03/14/16 0633  NA 139 139  K 2.9* 3.8  CL 104 105  CO2 28 29  GLUCOSE 97 101*  BUN 29* 27*  CREATININE 1.40* 1.71*  CALCIUM 8.4* 8.4*  MG 1.8 2.1   Liver Function Tests  Recent Labs  03/11/16 2142  AST 71*  ALT 52  ALKPHOS 111  BILITOT 0.3  PROT 6.7  ALBUMIN 3.4*   Cardiac Enzymes  Recent Labs  03/12/16 0126 03/12/16 0733 03/12/16 1309  TROPONINI 0.22* 0.26* 0.17*    Telemetry    I personally reviewed telemetry monitoring which shows sinus rhythm, brief episode of NSVT that was asymptomatic.  Radiology    Mr Wesley GlennMra Abdomen Wo Contrast  Result Date: 03/12/2016 CLINICAL DATA:  Hypertensive crisis and flash pulmonary edema. EXAM: MRA ABDOMEN WITHOUT CONTRAST TECHNIQUE: Multiplanar, multiecho pulse sequences of the abdomen were obtained  without intravenous contrast. Angiographic images of abdomen were obtained using MRA technique without intravenous contrast. CONTRAST:  None COMPARISON:  None. FINDINGS: MRA ABDOMEN FINDINGS Severe motion artifact limits the examination. The renal arteries are poorly visualized and cannot be assessed by this study. Renal artery stenosis cannot be excluded by this study. The aorta is non aneurysmal. Gallbladder, adrenal glands, spleen, and pancreas are grossly unremarkable. Tiny cysts in the kidneys are noted. There is a 1.5 cm T2 hyperintense lesion in the right lobe of the liver which is nonspecific. IMPRESSION: MRA ABDOMEN IMPRESSION Renal arteries are poorly visualized and cannot be evaluated on this study. Renal artery stenosis cannot be excluded 1.5 cm indeterminate lesion in the liver. Three phase liver MR is recommended. Electronically Signed   By: [name] M.D.   On: 03/12/2016  13:03    Cardiac Studies   Echocardiogram 03/09/2016: Study Conclusions  - Left ventricle: The cavity size was normal. Wall thickness was   normal. Systolic function was moderately to severely reduced. The   estimated ejection fraction was in the range of 30% to 35%.   Features are consistent with a pseudonormal left ventricular   filling pattern, with concomitant abnormal relaxation and   increased filling pressure (grade 2 diastolic dysfunction). - Aortic valve: There was mild regurgitation. - Left atrium: The atrium was mildly dilated.  Patient Profile     65 year old male with nonischemic cardiomyopathy, LVEF 30-35%, grade 2 diastolic dysfunction, CKD stage 3, uncontrolled hypertension, and preceding medication noncompliance now presenting with acute combined heart failure. Mildly elevated troponin I consistent with CHF rather than ACS. Clinically improving with medication adjustments including IV diuresis.  Assessment & Plan    1. Acute combined heart failure with history of nonischemic cardiomyopathy. LVEF 30-35% range by recent echocardiogram with associated grade 2 diastolic dysfunction. Clinically improved with diuresis, approximately 5400 cc out more than in.  2. Uncontrolled hypertension. Blood pressure better controlled. Now on Coreg, Losartan, and Bidil. Agree with recent assessment for possible RAS although MRA was not diagnostic.  3. CKD, stage 3, creatinine 1.7.  4. History of NSVT, asymptomatic.  5. Normal coronary arteries at cardiac catheterization February 2016.  Patient scheduled for follow-up chest x-ray by primary team. Anticipate discharge home today with close office follow-up with Dr. Purvis Riley (scheduled November 29 at 2 PM in [place] office). Long discussion this morning with patient and wife about cardiac status. Would discharge on current doses of Coreg, BiDil, and Cozaar. Suggest outpatient Lasix dose 60 mg daily for now since creatinine  bumping up on the 40 mg twice a day dose, follow daily weights. KCL 20 mEq daily. He can be scheduled for a renal artery duplex scan through our [facility] office as an outpatient to better evaluate possible RAS since MRA was not diagnostic.  Signed, Jonelle Sidle, M.D., F.A.C.C.  03/14/2016, 8:28 AM

## 2016-03-14 NOTE — Telephone Encounter (Signed)
Pt was d/c from the floor today--they wrote a Rx for Bidil, it will cost the pt $400 and he can't afford that, he's scheduled to f/u w/ [facility] on the 29th --please give the pt a call and let him know if he can take something else -- [phone]

## 2016-03-21 ENCOUNTER — Ambulatory Visit (INDEPENDENT_AMBULATORY_CARE_PROVIDER_SITE_OTHER): Payer: Medicare Other | Admitting: Cardiovascular Disease

## 2016-03-21 ENCOUNTER — Encounter: Payer: Self-pay | Admitting: Cardiovascular Disease

## 2016-03-21 VITALS — BP 159/91 | HR 59 | Ht 69.0 in | Wt 179.0 lb

## 2016-03-21 DIAGNOSIS — I509 Heart failure, unspecified: Secondary | ICD-10-CM

## 2016-03-21 DIAGNOSIS — I5022 Chronic systolic (congestive) heart failure: Secondary | ICD-10-CM

## 2016-03-21 DIAGNOSIS — I428 Other cardiomyopathies: Secondary | ICD-10-CM | POA: Diagnosis not present

## 2016-03-21 DIAGNOSIS — I472 Ventricular tachycardia: Secondary | ICD-10-CM

## 2016-03-21 DIAGNOSIS — I1310 Hypertensive heart and chronic kidney disease without heart failure, with stage 1 through stage 4 chronic kidney disease, or unspecified chronic kidney disease: Secondary | ICD-10-CM

## 2016-03-21 DIAGNOSIS — I701 Atherosclerosis of renal artery: Secondary | ICD-10-CM | POA: Diagnosis not present

## 2016-03-21 DIAGNOSIS — I4729 Other ventricular tachycardia: Secondary | ICD-10-CM

## 2016-03-21 DIAGNOSIS — Z9289 Personal history of other medical treatment: Secondary | ICD-10-CM

## 2016-03-21 MED ORDER — HYDRALAZINE HCL 50 MG PO TABS: 50.0000 mg | ORAL_TABLET | Freq: Three times a day (TID) | ORAL | 3 refills | Status: DC

## 2016-03-21 NOTE — Patient Instructions (Signed)
Medication Instructions:  Increase HYDRALAZINE TO 50 MG THREE TIMES DAILY   Labwork: NONE  Testing/Procedures: Your physician has requested that you have a renal artery duplex. During this test, an ultrasound is used to evaluate blood flow to the kidneys. Allow one hour for this exam. Do not eat after midnight the day before and avoid carbonated beverages. Take your medications as you usually do.    Follow-Up: Your physician recommends that you schedule a follow-up appointment in: TO BE DETERMINED    Any Other Special Instructions Will Be Listed Below (If Applicable). YOU HAVE BEEN REFERRED TO HEART FAILURE CLINIC, THEY WILL CONTACT YOU SOON TO SCHEDULE AN APPOINTMENT     If you need a refill on your cardiac medications before your next appointment, please call your pharmacy.

## 2016-03-21 NOTE — Progress Notes (Signed)
SUBJECTIVE: Patient presents for follow up after recently being hospitalized for acute on chronic systolic and diastolic heart failure.   Echo 03/09/16: EF 30-35%, grade 2 diastolic dysfunction.  Some suggestion for renal artery stenosis, albeit MRA was nondiagnostic.  Last recorded wt: 177 lbs (11/21).  He has a history of a nonischemic cardiomyopathy deemed to be secondary to hypertensive heart disease, with recalcitrant hypertension. He underwent coronary angiography on 06/21/14 which did not demonstrate any significant CAD. He also has a history of nonsustained ventricular tachycardia and currently takes carvedilol.   Stopped taking all his meds over the summer (due to "feeling terrible") and fluid retention began in September.  BP's at home remain elevated. Denies SOB and leg swelling today.   Review of Systems: As per "subjective", otherwise negative.    Current Outpatient Prescriptions  Medication Sig Dispense Refill  . aspirin EC 325 MG EC tablet Take 1 tablet (325 mg total) by mouth daily. 30 tablet 0  . carvedilol (COREG) 6.25 MG tablet Take 6.25 mg by mouth 2 (two) times daily with a meal.    . furosemide (LASIX) 20 MG tablet Take 3 tablets (60 mg total) by mouth daily. 30 tablet 1  . hydrALAZINE (APRESOLINE) 25 MG tablet Take 1 tablet (25 mg total) by mouth 3 (three) times daily. 270 tablet 0  . isosorbide dinitrate (ISORDIL) 10 MG tablet Take 1 tablet (10 mg total) by mouth 3 (three) times daily. 270 tablet 3  . losartan (COZAAR) 25 MG tablet Take 0.5 tablets (12.5 mg total) by mouth daily. 30 tablet 1  . NON FORMULARY Electromagnetic (all-body) beamer/stimulator: Lay on mat 8-12 minutes (per session) twice a day to stimulate vagus nerve activity    . OVER THE COUNTER MEDICATION Colloidal Silver: Take 30 ml's by mouth once a day    . OVER THE COUNTER MEDICATION Essential oils: One drop of peppermint oil applied to the tongue as needed for coughing    . potassium  chloride SA (K-DUR,KLOR-CON) 20 MEQ tablet Take 1 tablet (20 mEq total) by mouth daily. 30 tablet 0  . Vitamin D, Ergocalciferol, (DRISDOL) 50000 units CAPS capsule Take 50,000 Units by mouth every 7 (seven) days.     No current facility-administered medications for this visit.     Past Medical History:  Diagnosis Date  . CKD (chronic kidney disease) stage 3, GFR 30-59 ml/min   . Essential hypertension   . History of cardiac catheterization    Normal coronaries 05/2014  . Nonischemic cardiomyopathy (HCC)   . NSVT (nonsustained ventricular tachycardia) (HCC)     Past Surgical History:  Procedure Laterality Date  . HYDROCELE EXCISION    . KNEE SURGERY Right 2013  . LEFT HEART CATHETERIZATION WITH CORONARY ANGIOGRAM N/A 06/21/2014   Procedure: LEFT HEART CATHETERIZATION WITH CORONARY ANGIOGRAM;  Surgeon: Kathleene Hazelhristopher D McAlhany, MD;  [facility];  Service: Cardiovascular;  Laterality: N/A;  . VASECTOMY      Social History   Social History  . Marital status: Married    Spouse name: N/A  . Number of children: N/A  . Years of education: N/A   Occupational History  . Not on file.   Social History Main Topics  . Smoking status: Current Some Day Smoker    Packs/day: 1.00    Years: 46.00    Types: Cigars, Cigarettes    Start date: 04/23/1966  . Smokeless tobacco: Current User  . Alcohol use No  . Drug use: No  .  Sexual activity: Not on file   Other Topics Concern  . Not on file   Social History Narrative  . No narrative on file     Vitals:   03/21/16 1409  BP: (!) 159/91  Pulse: (!) 59  SpO2: 98%  Weight: 179 lb (81.2 kg)  Height: 5\' 9"  (1.753 m)    PHYSICAL EXAM General: NAD HEENT: Normal. Neck: No JVD, no thyromegaly. Lungs: Clear to auscultation bilaterally with normal respiratory effort. CV: Nondisplaced PMI.  Regular rate and rhythm, normal S1/S2, no S3/S4, no murmur. No pretibial or periankle edema.     Abdomen: Nontender, mild distention.    Neurologic: Alert and oriented.  Psych: Normal affect. Skin: Normal. Musculoskeletal: No gross deformities.    ECG: Most recent ECG reviewed.      ASSESSMENT AND PLAN: 1. Chronic systolic and diastolic heart failure/nonischemic cardiomyopathy, EF 30-35%: Had LVEF normalization in 09/2014, now declined again presumably due to uncontrolled hypertension. Will increase hydralazine to 50 mg tid. Continue Coreg, Isordil, and low-dose losartan. Will make appt with advanced HF clinic.  2. Malignant hypertension: Will order renal artery ultrasound to evaluate for stenosis. Will increase hydralazine to 50 mg tid.  3. NSVT: Asymptomatic.  Dispo: fu with advanced HF clinic in 1 month.  Time spent: 40 minutes, of which greater than 50% was spent reviewing symptoms, relevant blood tests and studies, and discussing management plan with the patient.   [name], M.D., F.A.C.C.

## 2016-03-27 ENCOUNTER — Telehealth: Payer: Self-pay | Admitting: Cardiovascular Disease

## 2016-03-27 NOTE — Telephone Encounter (Signed)
Got Scheduling on the phone about one of your patients.  [phone] Is this testing for renal stenosis ? or do you want them to look at the specific arteries?  [03/27/2016 4:07 PM] [name]:  both (renal artery stenosis) Per Central Scheduling this will need to be cancelled and re-scheduled at [facility].  Call came from April - her direct line is [phone].

## 2016-03-28 ENCOUNTER — Ambulatory Visit (HOSPITAL_COMMUNITY): Admission: RE | Admit: 2016-03-28 | Payer: Medicare Other | Source: Ambulatory Visit

## 2016-03-28 ENCOUNTER — Other Ambulatory Visit: Payer: Self-pay

## 2016-03-28 DIAGNOSIS — I10 Essential (primary) hypertension: Secondary | ICD-10-CM

## 2016-04-02 ENCOUNTER — Ambulatory Visit (HOSPITAL_COMMUNITY)
Admission: RE | Admit: 2016-04-02 | Discharge: 2016-04-02 | Disposition: A | Discharge status: Home / Self Care | Payer: Medicare Other | Source: Ambulatory Visit | Attending: Cardiovascular Disease | Admitting: Cardiovascular Disease

## 2016-04-02 DIAGNOSIS — I701 Atherosclerosis of renal artery: Secondary | ICD-10-CM | POA: Insufficient documentation

## 2016-04-02 DIAGNOSIS — I10 Essential (primary) hypertension: Secondary | ICD-10-CM | POA: Insufficient documentation

## 2016-04-02 NOTE — Progress Notes (Addendum)
*  PRELIMINARY RESULTS* Vascular Ultrasound Renal Artery Duplex has been completed.  Preliminary findings: Right renal artery demonstrates 1-59% stenosis, but this could be due to curvature of vessel at level of elevated velocities. No evidence of left renal artery stenosis.      Farrel Demark, RDMS, RVT  04/02/2016, 9:40 AM

## 2016-04-03 ENCOUNTER — Telehealth: Payer: Self-pay | Admitting: *Deleted

## 2016-04-03 NOTE — Telephone Encounter (Signed)
Notes Recorded by Lesle Chris, LPN on 25/00/3704 at 12:10 PM EST Patient notified and verbalized understanding. Copy to pmd. ------  Notes Recorded by Laqueta Linden, MD on 04/03/2016 at 8:13 AM EST No significant blockages.

## 2016-04-30 ENCOUNTER — Encounter (HOSPITAL_COMMUNITY): Payer: Self-pay | Admitting: Internal Medicine

## 2016-04-30 ENCOUNTER — Ambulatory Visit (HOSPITAL_COMMUNITY)
Admission: RE | Admit: 2016-04-30 | Discharge: 2016-04-30 | Disposition: A | Discharge status: Home / Self Care | Payer: Medicare Other | Source: Ambulatory Visit | Attending: Internal Medicine | Admitting: Internal Medicine

## 2016-04-30 VITALS — BP 174/100 | HR 67 | Wt 184.0 lb

## 2016-04-30 DIAGNOSIS — I429 Cardiomyopathy, unspecified: Secondary | ICD-10-CM | POA: Insufficient documentation

## 2016-04-30 DIAGNOSIS — I5022 Chronic systolic (congestive) heart failure: Secondary | ICD-10-CM | POA: Diagnosis not present

## 2016-04-30 DIAGNOSIS — I130 Hypertensive heart and chronic kidney disease with heart failure and stage 1 through stage 4 chronic kidney disease, or unspecified chronic kidney disease: Secondary | ICD-10-CM | POA: Insufficient documentation

## 2016-04-30 DIAGNOSIS — I472 Ventricular tachycardia: Secondary | ICD-10-CM | POA: Insufficient documentation

## 2016-04-30 DIAGNOSIS — F17210 Nicotine dependence, cigarettes, uncomplicated: Secondary | ICD-10-CM | POA: Insufficient documentation

## 2016-04-30 DIAGNOSIS — Z7982 Long term (current) use of aspirin: Secondary | ICD-10-CM | POA: Insufficient documentation

## 2016-04-30 DIAGNOSIS — Z9889 Other specified postprocedural states: Secondary | ICD-10-CM | POA: Insufficient documentation

## 2016-04-30 DIAGNOSIS — F419 Anxiety disorder, unspecified: Secondary | ICD-10-CM | POA: Insufficient documentation

## 2016-04-30 DIAGNOSIS — F17290 Nicotine dependence, other tobacco product, uncomplicated: Secondary | ICD-10-CM | POA: Insufficient documentation

## 2016-04-30 DIAGNOSIS — Z720 Tobacco use: Secondary | ICD-10-CM

## 2016-04-30 DIAGNOSIS — Z79899 Other long term (current) drug therapy: Secondary | ICD-10-CM | POA: Diagnosis not present

## 2016-04-30 DIAGNOSIS — F429 Obsessive-compulsive disorder, unspecified: Secondary | ICD-10-CM | POA: Insufficient documentation

## 2016-04-30 DIAGNOSIS — I5042 Chronic combined systolic (congestive) and diastolic (congestive) heart failure: Secondary | ICD-10-CM | POA: Insufficient documentation

## 2016-04-30 DIAGNOSIS — I10 Essential (primary) hypertension: Secondary | ICD-10-CM | POA: Diagnosis not present

## 2016-04-30 DIAGNOSIS — I428 Other cardiomyopathies: Secondary | ICD-10-CM | POA: Diagnosis not present

## 2016-04-30 MED ORDER — SACUBITRIL-VALSARTAN 49-51 MG PO TABS: 1.0000 | ORAL_TABLET | Freq: Two times a day (BID) | ORAL | 11 refills | Status: DC

## 2016-04-30 MED ORDER — SACUBITRIL-VALSARTAN 49-51 MG PO TABS: 1.0000 | ORAL_TABLET | Freq: Two times a day (BID) | ORAL | 3 refills | Status: DC

## 2016-04-30 MED ORDER — CARVEDILOL 12.5 MG PO TABS: 12.5000 mg | ORAL_TABLET | Freq: Two times a day (BID) | ORAL | 3 refills | Status: DC

## 2016-04-30 NOTE — Patient Instructions (Addendum)
Stop Losartan  Start Entresto 49/51 mg Twice daily   Change Carvedilol to 12.5 mg Twice daily   Lab in 1 week at Primary Care doctor  Take your Blood Pressure only 2 times a day  Your physician has requested that you have an echocardiogram. Echocardiography is a painless test that uses sound waves to create images of your heart. It provides your doctor with information about the size and shape of your heart and how well your heart's chambers and valves are working. This procedure takes approximately one hour. There are no restrictions for this procedure.  IN 2 MONTHS  Your physician recommends that you schedule a follow-up appointment in: 2 months

## 2016-04-30 NOTE — Progress Notes (Signed)
ADVANCED HF CONSULT NOTE   PCP: Wesley Riley Cardiologist: Wesley Riley   History of Present Illness:   Wesley Riley is a 66 y/o male with h/o severe HTN, tobacco use, NSVT, anxiety and systolic HF related to NICM (felt to be HTN CM) who is referred by Wesley Riley for further evaluation in the HF Clinic.   He has a history of a nonischemic cardiomyopathy deemed to be secondary to hypertensive heart disease dating back to 2016. He underwent coronary angiography on 06/21/14 which did not demonstrate any significant CAD. EF recovered in 6/16 but then declined again after stopping medicines. He says his BP control never improved on these medicines so he does not think medicines were responsible for making heart better.   He has spent extensive amounts of time researching alternate therapies for  His HTN and HF. He started using a Bemer machine - a pulse electromagnetic field generator to help. He stopped taking all his meds over the summer (due to "feeling terrible") and fluid retention began in September. He says he felt a lot better when he stopped his medicines. Echo 03/09/16: showed recurrent systolic dysfunction with EF 30-35%, grade 2 diastolic dysfunction. Had MRA with possible renal artery stenosis, albeit MRA was nondiagnostic. Renal artery u/s pending.   Has been following with Wesley Riley and is now taking his HF meds. Denies edema.Takes meticulous records with taking BP 5+ times per day. Average SBP 140-150s. Many readings 160-145mmHG. Feels like he is in a brain fog. Weight stable 179-181. Breathing ok. Sits at the computer most of the day researching natural medicine therapies and looking for corruption in [place] [employer] [place]. Able to do all ADLs without problem. No CP.     Still smoking 1/2 ppd.     Current Outpatient Prescriptions  Medication Sig Dispense Refill  . aspirin EC 81 MG tablet Take 81 mg by mouth daily.    . carvedilol (COREG) 6.25 MG tablet Take 6.25 mg by  mouth as directed. Take 1.5 tablets in the AM, 1 tablet at noon, 1.5 tablets in the PM    . furosemide (LASIX) 20 MG tablet Take 60 mg by mouth daily.    . hydrALAZINE (APRESOLINE) 25 MG tablet Take 25 mg by mouth 3 (three) times daily.    . isosorbide dinitrate (ISORDIL) 10 MG tablet Take 10 mg by mouth 3 (three) times daily.    Marland Kitchen losartan (COZAAR) 25 MG tablet Take 12.5 mg by mouth daily.    . NON FORMULARY Electromagnetic (all-body) beamer/stimulator: Lay on mat 8-12 minutes (per session) twice a day to stimulate vagus nerve activity    . OVER THE COUNTER MEDICATION Colloidal Silver: Take 30 ml's by mouth once a day    . OVER THE COUNTER MEDICATION Essential oils: One drop of peppermint oil applied to the tongue as needed for coughing    . potassium chloride SA (K-DUR,KLOR-CON) 20 MEQ tablet Take 20 mEq by mouth daily.    Marland Kitchen spironolactone (ALDACTONE) 25 MG tablet Take 12.5 mg by mouth daily.    . Vitamin D, Ergocalciferol, (DRISDOL) 50000 units CAPS capsule Take 50,000 Units by mouth every 7 (seven) days.     No current facility-administered medications for this encounter.     Past Medical History:  Diagnosis Date  . CKD (chronic kidney disease) stage 3, GFR 30-59 ml/min   . Essential hypertension   . History of cardiac catheterization    Normal coronaries 05/2014  . Nonischemic cardiomyopathy (HCC)   .  NSVT (nonsustained ventricular tachycardia) (HCC)     Past Surgical History:  Procedure Laterality Date  . HYDROCELE EXCISION    . KNEE SURGERY Right 2013  . LEFT HEART CATHETERIZATION WITH CORONARY ANGIOGRAM N/A 06/21/2014   Procedure: LEFT HEART CATHETERIZATION WITH CORONARY ANGIOGRAM;  Surgeon: Kathleene Hazel, MD;  Location: [facility] CATH LAB;  Service: Cardiovascular;  Laterality: N/A;  . VASECTOMY      Social History   Social History  . Marital status: Married    Spouse name: N/A  . Number of children: N/A  . Years of education: N/A   Occupational History  . Not on  file.   Social History Main Topics  . Smoking status: Current Some Day Smoker    Packs/day: 1.00    Years: 46.00    Types: Cigars, Cigarettes    Start date: 04/23/1966  . Smokeless tobacco: Current User  . Alcohol use No  . Drug use: No  . Sexual activity: Not on file   Other Topics Concern  . Not on file   Social History Narrative  . No narrative on file     Vitals:   04/30/16 1122  BP: (!) 174/100  Pulse: 67  SpO2: 98%  Weight: 184 lb (83.5 kg)    PHYSICAL EXAM General: NAD HEENT: Normal. Neck: No JVD, no thyromegaly. Lungs: Clear to auscultation bilaterally with normal respiratory effort. CV: Nondisplaced PMI.  Regular rate and rhythm, normal S1/S2, no S3/S4, no murmur. No pretibial or periankle edema.     Abdomen: Nontender,no distention.  Neurologic: Alert and oriented.  Psych: Normal affect. Skin: Normal. Musculoskeletal: No gross deformities.  ECG 03/12/16: STach 124 +LVH  QRS 93   ASSESSMENT AND PLAN: 1. Chronic systolic and diastolic heart failure/nonischemic cardiomyopathy (cath 2/16), Echo 11/17: EF 30-35%: Had LVEF normalization in 09/2014, now declined again presumably due to uncontrolled hypertension.  --Volume status looks good. --NYHA II --Continue carvedilol at current dose - taking 3x/day be cause he tolerates better equivalent to 12.5 bid --Switch losartan to Entresto 49/51 bid for HF and help with HTN. Discussed possible need to reduce lasix with switch to Entresto --Continue hydralazine 25 tid and ISDN 10 tid  2. Malignant hypertension --BP improved but still elevated. Med changes as above  --Wesley Riley has ordered renal artery ultrasound to evaluate for stenosis. (MRA non-diagnostic)  3. NSVT:  --Quiescent. Continue b-blocker. Check electrolytes --Repeat echo in several months. If EF <=35% after med titration will need to consider ICD  4. CKD, stage III --resolved  5. Tobacco use --Counseled on need to stop smoking  6.  Anxiety/OCD --Extensive discussion with him about risks of not taking medications and also suggested he not obsess as much over taking his VS. Told him to limit BP checks to 2x/day. Likely needs formal counseling.   Total time spent 55 minutes. Over half that time spent discussing above.    Dominie Benedick,MD 12:08 PM

## 2016-05-01 ENCOUNTER — Telehealth (HOSPITAL_COMMUNITY): Payer: Self-pay | Admitting: *Deleted

## 2016-05-01 NOTE — Telephone Encounter (Signed)
Note faxed to Dr Charm Barges

## 2016-05-01 NOTE — Telephone Encounter (Signed)
-----   Message from Dolores Patty, MD sent at 04/30/2016  7:16 PM EST ----- Please fax to Dr. [name].

## 2016-07-02 ENCOUNTER — Ambulatory Visit (HOSPITAL_BASED_OUTPATIENT_CLINIC_OR_DEPARTMENT_OTHER)
Admission: RE | Admit: 2016-07-02 | Discharge: 2016-07-02 | Disposition: A | Discharge status: Home / Self Care | Payer: Medicare Other | Source: Ambulatory Visit | Attending: Internal Medicine | Admitting: Internal Medicine

## 2016-07-02 ENCOUNTER — Ambulatory Visit (HOSPITAL_COMMUNITY)
Admission: RE | Admit: 2016-07-02 | Discharge: 2016-07-02 | Disposition: A | Discharge status: Home / Self Care | Payer: Medicare Other | Source: Ambulatory Visit | Attending: *Deleted | Admitting: *Deleted

## 2016-07-02 ENCOUNTER — Encounter (HOSPITAL_COMMUNITY): Payer: Self-pay | Admitting: Internal Medicine

## 2016-07-02 VITALS — BP 158/100 | HR 73 | Wt 177.5 lb

## 2016-07-02 DIAGNOSIS — I428 Other cardiomyopathies: Secondary | ICD-10-CM | POA: Diagnosis not present

## 2016-07-02 DIAGNOSIS — I429 Cardiomyopathy, unspecified: Secondary | ICD-10-CM | POA: Diagnosis not present

## 2016-07-02 DIAGNOSIS — I472 Ventricular tachycardia: Secondary | ICD-10-CM | POA: Insufficient documentation

## 2016-07-02 DIAGNOSIS — F429 Obsessive-compulsive disorder, unspecified: Secondary | ICD-10-CM | POA: Insufficient documentation

## 2016-07-02 DIAGNOSIS — Z9889 Other specified postprocedural states: Secondary | ICD-10-CM | POA: Diagnosis not present

## 2016-07-02 DIAGNOSIS — Z7982 Long term (current) use of aspirin: Secondary | ICD-10-CM | POA: Insufficient documentation

## 2016-07-02 DIAGNOSIS — F17290 Nicotine dependence, other tobacco product, uncomplicated: Secondary | ICD-10-CM | POA: Diagnosis not present

## 2016-07-02 DIAGNOSIS — I5042 Chronic combined systolic (congestive) and diastolic (congestive) heart failure: Secondary | ICD-10-CM | POA: Insufficient documentation

## 2016-07-02 DIAGNOSIS — I351 Nonrheumatic aortic (valve) insufficiency: Secondary | ICD-10-CM | POA: Diagnosis not present

## 2016-07-02 DIAGNOSIS — N183 Chronic kidney disease, stage 3 (moderate): Secondary | ICD-10-CM | POA: Diagnosis not present

## 2016-07-02 DIAGNOSIS — I130 Hypertensive heart and chronic kidney disease with heart failure and stage 1 through stage 4 chronic kidney disease, or unspecified chronic kidney disease: Secondary | ICD-10-CM | POA: Insufficient documentation

## 2016-07-02 DIAGNOSIS — I4729 Other ventricular tachycardia: Secondary | ICD-10-CM

## 2016-07-02 DIAGNOSIS — F419 Anxiety disorder, unspecified: Secondary | ICD-10-CM | POA: Diagnosis not present

## 2016-07-02 DIAGNOSIS — I313 Pericardial effusion (noninflammatory): Secondary | ICD-10-CM | POA: Diagnosis not present

## 2016-07-02 DIAGNOSIS — I10 Essential (primary) hypertension: Secondary | ICD-10-CM

## 2016-07-02 DIAGNOSIS — Z79899 Other long term (current) drug therapy: Secondary | ICD-10-CM | POA: Insufficient documentation

## 2016-07-02 DIAGNOSIS — I5022 Chronic systolic (congestive) heart failure: Secondary | ICD-10-CM | POA: Diagnosis not present

## 2016-07-02 LAB — BASIC METABOLIC PANEL
Anion gap: 10 (ref 5–15)
BUN: 12 mg/dL (ref 6–20)
CO2: 30 mmol/L (ref 22–32)
Calcium: 9.3 mg/dL (ref 8.9–10.3)
Chloride: 102 mmol/L (ref 101–111)
Creatinine, Ser: 1.4 mg/dL — ABNORMAL HIGH (ref 0.61–1.24)
GFR, EST AFRICAN AMERICAN: 59 mL/min — AB (ref >60)
GFR, EST NON AFRICAN AMERICAN: 51 mL/min — AB (ref >60)
Glucose, Bld: 121 mg/dL — ABNORMAL HIGH (ref 65–99)
POTASSIUM: 3.1 mmol/L — AB (ref 3.5–5.1)
Sodium: 142 mmol/L (ref 135–145)

## 2016-07-02 MED ORDER — LOSARTAN POTASSIUM 25 MG PO TABS: 25.0000 mg | ORAL_TABLET | Freq: Every day | ORAL | 3 refills | Status: DC

## 2016-07-02 NOTE — Progress Notes (Signed)
ADVANCED HF CLINIC NOTE   PCP: [name] Cardiologist: [name]   History of Present Illness:   [name] is a 66 y/o male with h/o severe HTN, tobacco use, NSVT, anxiety and systolic HF related to NICM (felt to be HTN CM) who is referred by Dr. Darl Householder for further evaluation in the HF Clinic.   He has a history of a nonischemic cardiomyopathy deemed to be secondary to hypertensive heart disease dating back to 2016. He underwent coronary angiography on 06/21/14 which did not demonstrate any significant CAD. EF recovered in 6/16 but then declined again after stopping medicines. He says his BP control never improved on these medicines so he does not think medicines were responsible for making heart better.   He has spent extensive amounts of time researching alternate therapies for his HTN and HF. He started using a Bemer machine - a pulse electromagnetic field generator to help. He stopped taking all his meds over the summer (due to "feeling terrible") and fluid retention began in September. He says he felt a lot better when he stopped his medicines. Echo 03/09/16: showed recurrent systolic dysfunction with EF 30-35%, grade 2 diastolic dysfunction. Had MRA with possible renal artery stenosis, albeit MRA was nondiagnostic. Renal artery u/s pending.   Has been following with Dr. Darl Householder and restarted his HF meds. We saw him for the first time in the HF Clinic in January. NYHA II. BP was high. I recommended switching losartan to [facility] 49/51 but he didn't switch. He has continued to take losartan 12.5 mg daily. Said he has gotten his BP down to under 145/90 with most checks Denies edema.Continues to check BP 1-2x/day. Walking 1 mile per day. Weight down 2-3 to 178. Watching his diet. No CP. Smoking 5 cigarettes/day - says it really calms him.     Still smoking 1/2 ppd.   Echo today: Reviewed personally EF 40%  Current Outpatient Prescriptions  Medication Sig Dispense Refill  .  aspirin EC 81 MG tablet Take 81 mg by mouth daily.    . carvedilol (COREG) 12.5 MG tablet Take 1 tablet (12.5 mg total) by mouth 2 (two) times daily with a meal. 60 tablet 3  . furosemide (LASIX) 20 MG tablet Take 60 mg by mouth daily.    . hydrALAZINE (APRESOLINE) 25 MG tablet Take 25 mg by mouth 3 (three) times daily.    . isosorbide dinitrate (ISORDIL) 10 MG tablet Take 10 mg by mouth 3 (three) times daily.    . NON FORMULARY Electromagnetic (all-body) beamer/stimulator: Lay on mat 8-12 minutes (per session) twice a day to stimulate vagus nerve activity    . OVER THE COUNTER MEDICATION Colloidal Silver: Take 30 ml's by mouth once a day    . OVER THE COUNTER MEDICATION Essential oils: One drop of peppermint oil applied to the tongue as needed for coughing    . potassium chloride SA (K-DUR,KLOR-CON) 20 MEQ tablet Take 20 mEq by mouth daily.    Marland Kitchen spironolactone (ALDACTONE) 25 MG tablet Take 12.5 mg by mouth daily.    . Vitamin D, Ergocalciferol, (DRISDOL) 50000 units CAPS capsule Take 50,000 Units by mouth every 7 (seven) days.    . sacubitril-valsartan (ENTRESTO) 49-51 MG Take 1 tablet by mouth 2 (two) times daily. (Patient not taking: Reported on 07/02/2016) 60 tablet 11   No current facility-administered medications for this encounter.     Past Medical History:  Diagnosis Date  . CKD (chronic kidney disease) stage 3, GFR  30-59 ml/min   . Essential hypertension   . History of cardiac catheterization    Normal coronaries 05/2014  . Nonischemic cardiomyopathy (HCC)   . NSVT (nonsustained ventricular tachycardia) (HCC)     Past Surgical History:  Procedure Laterality Date  . HYDROCELE EXCISION    . KNEE SURGERY Right 2013  . LEFT HEART CATHETERIZATION WITH CORONARY ANGIOGRAM N/A 06/21/2014   Procedure: LEFT HEART CATHETERIZATION WITH CORONARY ANGIOGRAM;  Surgeon: Kathleene Hazel, MD;  Location: [facility] CATH LAB;  Service: Cardiovascular;  Laterality: N/A;  . VASECTOMY      Social  History   Social History  . Marital status: Married    Spouse name: N/A  . Number of children: N/A  . Years of education: N/A   Occupational History  . Not on file.   Social History Main Topics  . Smoking status: Current Some Day Smoker    Packs/day: 1.00    Years: 46.00    Types: Cigars, Cigarettes    Start date: 04/23/1966  . Smokeless tobacco: Current User  . Alcohol use No  . Drug use: No  . Sexual activity: Not on file   Other Topics Concern  . Not on file   Social History Narrative  . No narrative on file     Vitals:   07/02/16 1439  BP: (!) 158/100  Pulse: 73  SpO2: 98%  Weight: 177 lb 8 oz (80.5 kg)    PHYSICAL EXAM General: NAD HEENT: Normal. Neck: No JVD, no thyromegaly. Lungs: Clear to auscultation bilaterally with normal respiratory effort. CV: Nondisplaced PMI.  Regular rate and rhythm, normal S1/S2, no S3/S4, no murmur. No pretibial or periankle edema. + yellow nails.  Abdomen: Nontender,no distention.  Neurologic: Alert and oriented.  Psych: Normal affect. Skin: Normal. Musculoskeletal: No gross deformities.   ASSESSMENT AND PLAN: 1. Chronic systolic and diastolic heart failure/nonischemic cardiomyopathy (cath 2/16), Echo 11/17: EF 30-35%: Had LVEF normalization in 09/2014, now declined again presumably due to uncontrolled hypertension. Echo today EF 40% --Volume status looks good on lasix 60 daily --NYHA I-II --Continue carvedilol at current dose - taking 3x/day be cause he tolerates better equivalent to 12.5 bid --Continue hydralazine 25 tid and ISDN 10 tid --He refuses Entresto. I have asked that he increase Losartan to 25 daily to try and get BP < 140/90  2. Malignant hypertension --BP improved but still elevated. Increasing losartan to 25 daily. He is resistant to other changes.  --Dr. Darl Householder has ordered renal artery ultrasound to evaluate for stenosis. (MRA non-diagnostic)  3. NSVT:  --Quiescent. Continue b-blocker. Check  electrolytes --If EF > 35% on echo today so will not need ICD  4. CKD, stage III --Creatinine stable 1.4-1.7. Check BMET today  5. Tobacco use --Counseled on need to stop smoking  6. Anxiety/OCD --He is reluctant to follow many of the recommendations we have made to him and continues to base most of his decision making based on things he reads on the Internet. I have explained that this may be detrimental to his health.     Creedence Kunesh,MD 2:44 PM

## 2016-07-02 NOTE — Patient Instructions (Signed)
Take Losartan 25mg  daily.  Routine lab work today. Will notify you of abnormal results  Follow up with Dr.Bensimhon in 4 months.

## 2016-07-02 NOTE — Progress Notes (Signed)
  Echocardiogram 2D Echocardiogram has been performed.  Leta Jungling M 07/02/2016, 2:27 PM

## 2016-07-11 ENCOUNTER — Telehealth (HOSPITAL_COMMUNITY): Payer: Self-pay | Admitting: *Deleted

## 2016-07-11 DIAGNOSIS — I5022 Chronic systolic (congestive) heart failure: Secondary | ICD-10-CM

## 2016-07-11 MED ORDER — POTASSIUM CHLORIDE CRYS ER 20 MEQ PO TBCR: 40.0000 meq | EXTENDED_RELEASE_TABLET | Freq: Every day | ORAL | 3 refills | Status: DC

## 2016-07-11 MED ORDER — SPIRONOLACTONE 25 MG PO TABS: 25.0000 mg | ORAL_TABLET | Freq: Every day | ORAL | 3 refills | Status: AC

## 2016-07-11 NOTE — Telephone Encounter (Signed)
Basic metabolic panel  Order: [id number]  Status:  Final result Visible to patient:  Yes (MyChart) Dx:  Chronic systolic heart failure (HCC)  Notes recorded by Suezanne Cheshire, RN on 07/11/2016 at 4:26 PM EDT Patient called back and he is agreeable with plan. Appt made, lab order placed, and medications updated. ------  Notes recorded by Suezanne Cheshire, RN on 07/11/2016 at 12:58 PM EDT Called patient but had to leave VM asking for him to call us back. ------  Notes recorded by Dolores Patty, MD on 07/06/2016 at 8:33 PM EDT Increase spiro to 25 daily. Add kcl 20. Take 60 spiro the first day, Recheck 2 weeks. ------  Notes recorded by Noralee Space, RN on 07/05/2016 at 3:59 PM EDT K low, Labs reviewed by RN, will forward to MD for review

## 2016-07-25 ENCOUNTER — Ambulatory Visit (HOSPITAL_COMMUNITY)
Admission: RE | Admit: 2016-07-25 | Discharge: 2016-07-25 | Disposition: A | Discharge status: Home / Self Care | Payer: Medicare Other | Source: Ambulatory Visit | Attending: Internal Medicine | Admitting: Internal Medicine

## 2016-07-25 DIAGNOSIS — I5022 Chronic systolic (congestive) heart failure: Secondary | ICD-10-CM | POA: Insufficient documentation

## 2016-07-25 LAB — BASIC METABOLIC PANEL
Anion gap: 8 (ref 5–15)
BUN: 17 mg/dL (ref 6–20)
CO2: 27 mmol/L (ref 22–32)
CREATININE: 1.44 mg/dL — AB (ref 0.61–1.24)
Calcium: 9.1 mg/dL (ref 8.9–10.3)
Chloride: 106 mmol/L (ref 101–111)
GFR, EST AFRICAN AMERICAN: 57 mL/min — AB (ref >60)
GFR, EST NON AFRICAN AMERICAN: 49 mL/min — AB (ref >60)
Glucose, Bld: 106 mg/dL — ABNORMAL HIGH (ref 65–99)
Potassium: 3.7 mmol/L (ref 3.5–5.1)
SODIUM: 141 mmol/L (ref 135–145)

## 2016-08-01 ENCOUNTER — Other Ambulatory Visit (HOSPITAL_COMMUNITY): Payer: Self-pay | Admitting: Internal Medicine

## 2016-09-04 ENCOUNTER — Other Ambulatory Visit (HOSPITAL_COMMUNITY): Payer: Self-pay | Admitting: Internal Medicine

## 2017-01-23 ENCOUNTER — Other Ambulatory Visit (HOSPITAL_COMMUNITY): Payer: Self-pay | Admitting: Internal Medicine

## 2017-01-23 ENCOUNTER — Other Ambulatory Visit (HOSPITAL_COMMUNITY): Payer: Self-pay | Admitting: Cardiology

## 2017-03-04 ENCOUNTER — Other Ambulatory Visit (HOSPITAL_COMMUNITY): Payer: Self-pay | Admitting: Internal Medicine

## 2017-06-14 IMAGING — CR DG CHEST 1V PORT
1 series · 1 of 1 positions shown · non-contrast
Comparison: 03/07/2016

CLINICAL DATA: Shortness of breath and diaphoresis. History of
congestive heart failure, hypertension, and smoker.

EXAM:
PORTABLE CHEST 1 VIEW

[portable]
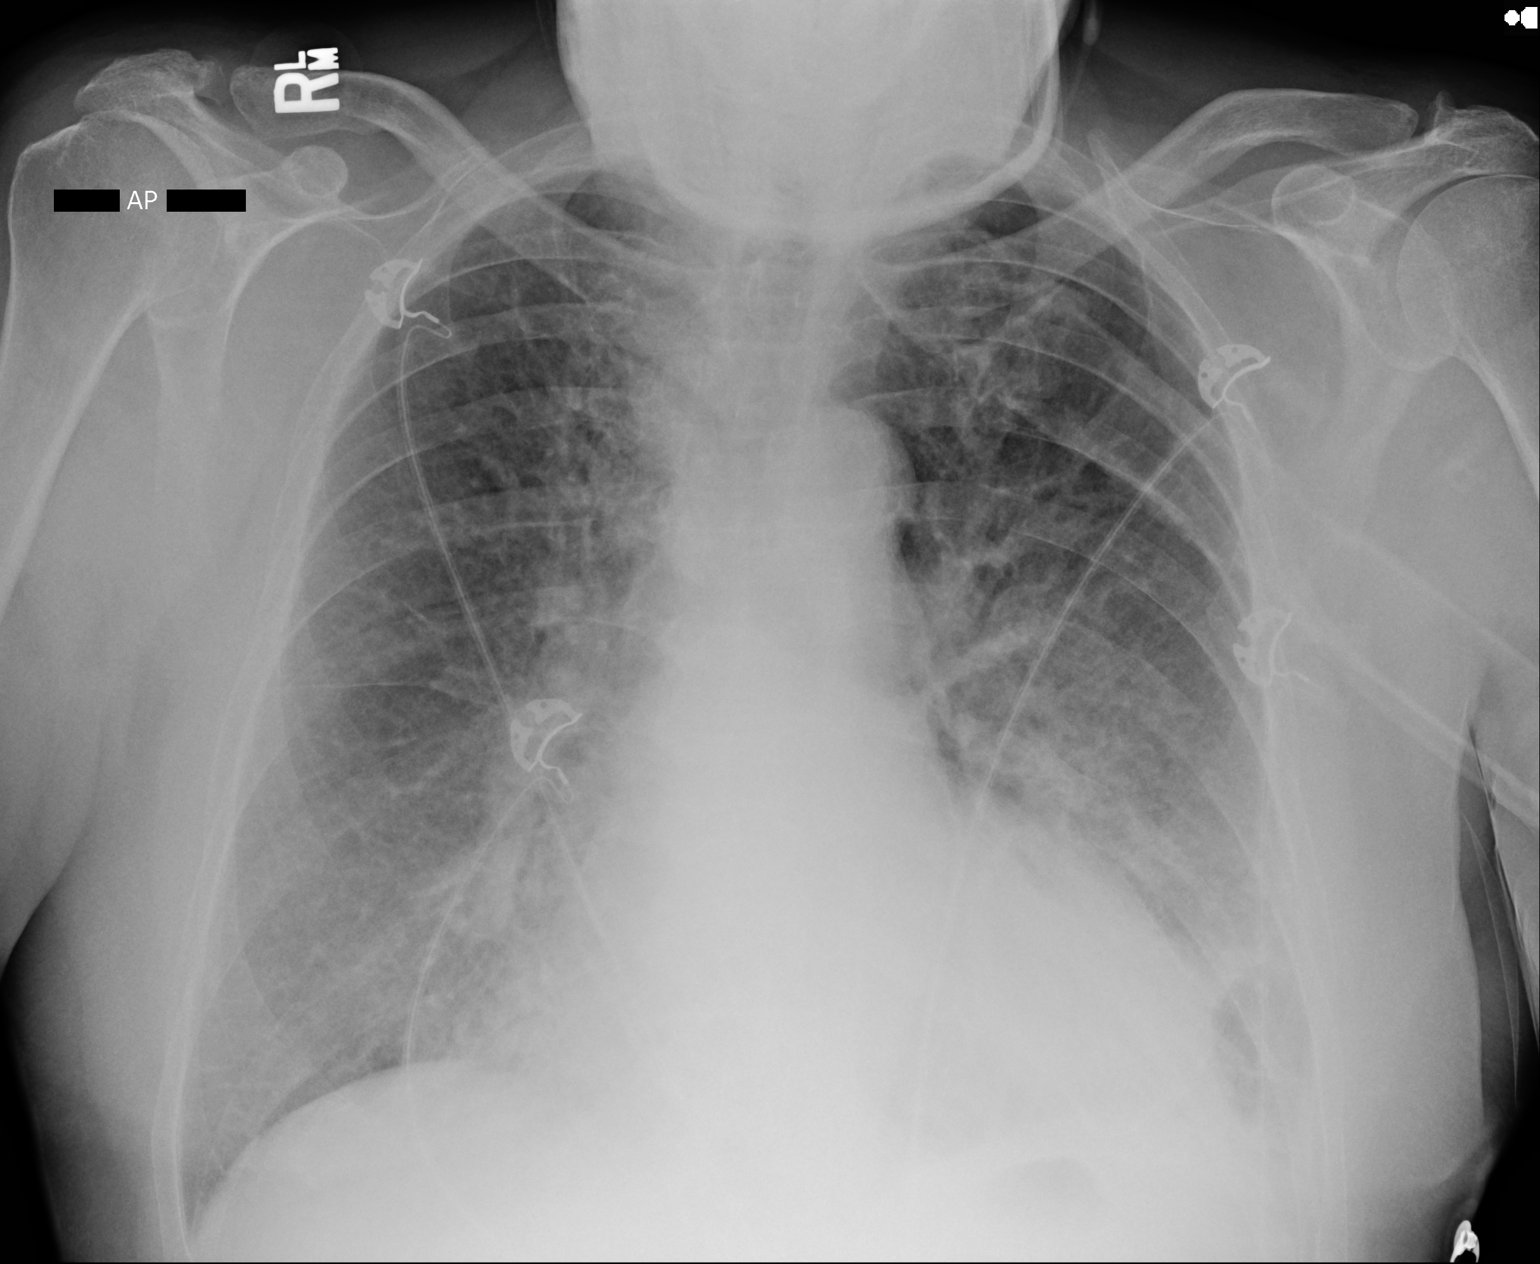

[1 of 1 positions shown; findings below may reference images not displayed]

FINDINGS: Cardiac enlargement with pulmonary vascular congestion. Bilateral
interstitial and perihilar alveolar infiltration. Infiltrates are
progressing since previous study and likely indicating edema.
Bilateral pneumonia could also have this appearance. Probable small
left pleural effusion. No pneumothorax. Calcification and torsion of
the aorta.
IMPRESSION: Progressing changes of congestive heart failure since previous study
with developing interstitial and alveolar edema. Small left pleural
effusion.

## 2017-06-17 IMAGING — DX DG CHEST 2V
2 series · 3 of 3 positions shown · non-contrast
Comparison: 03/11/2016.

CLINICAL DATA: Cough.

EXAM:
CHEST  2 VIEW

[Series 1: chest pa · 0.14mm/px · 2 of 2 slices shown]
[im 1/2]
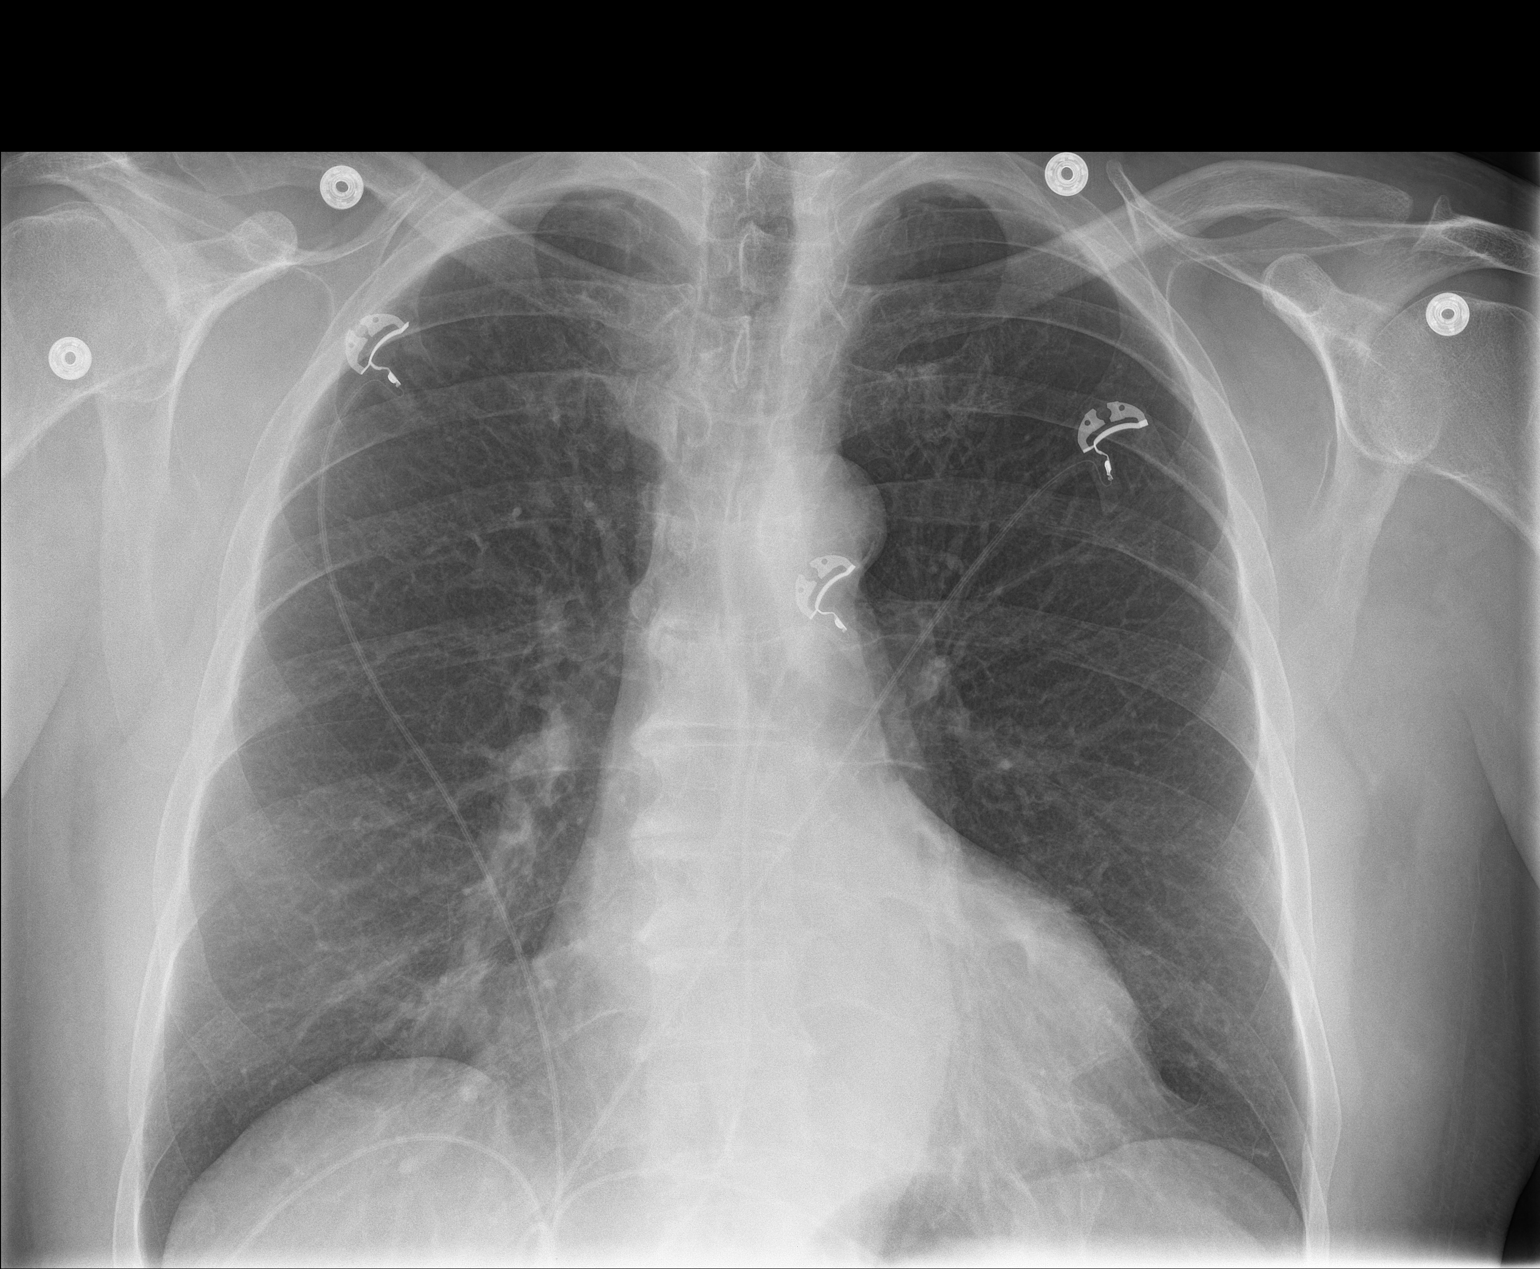
[im 2/2]
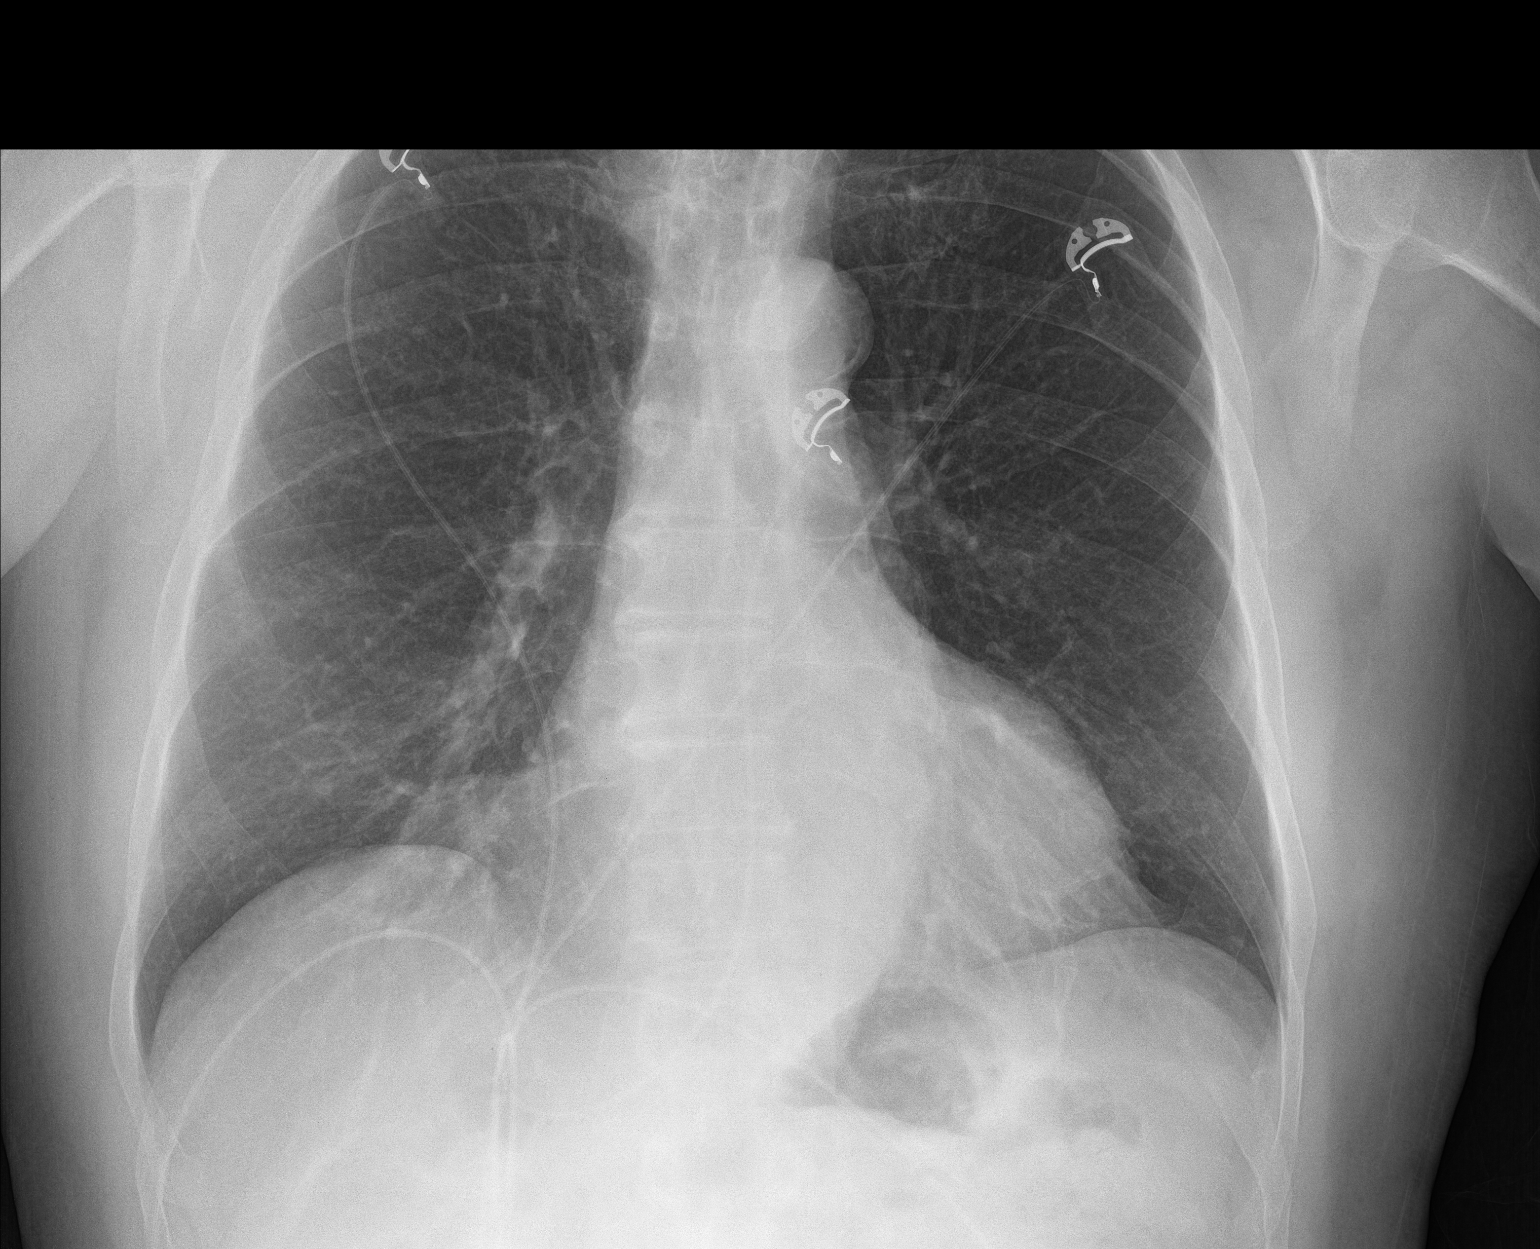

[chest lat]
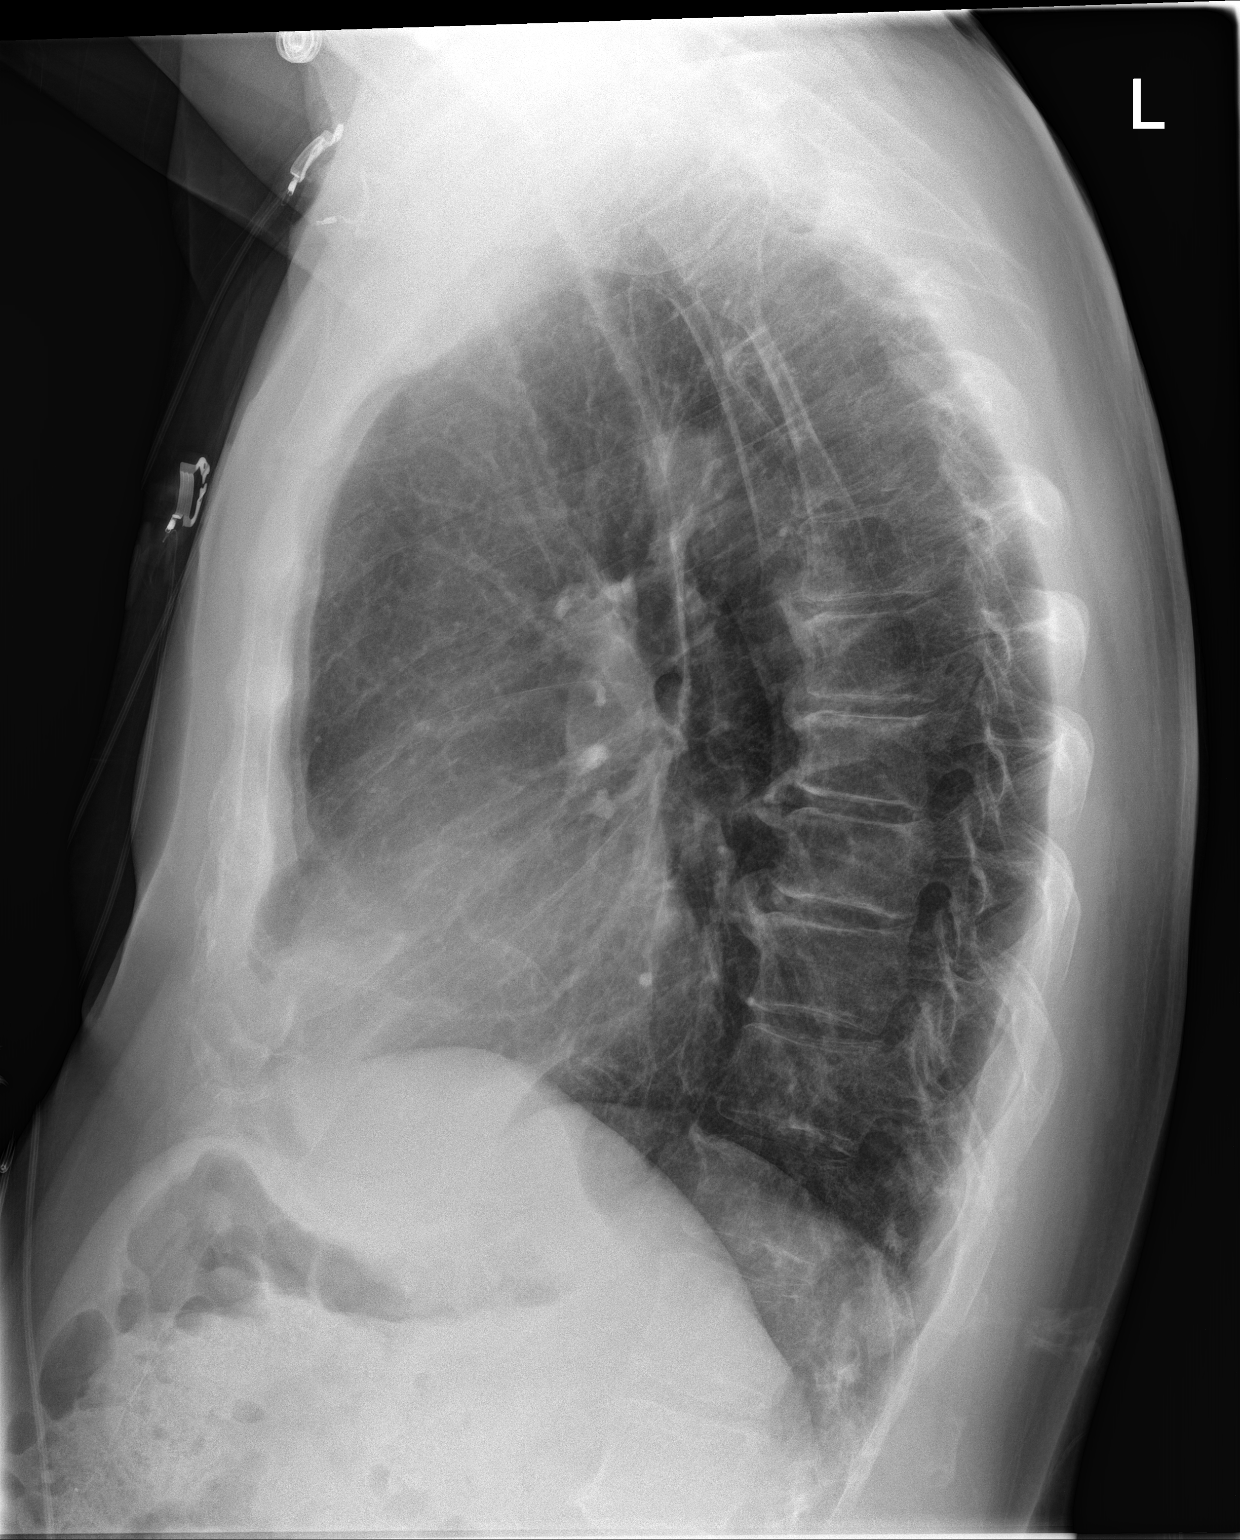

[3 of 3 positions shown; findings below may reference images not displayed]

FINDINGS: Mediastinum and hilar structures are normal. Cardiomegaly. Interval
clearing of pulmonary edema. Mild right base subsegmental
atelectasis. No pleural effusion or pneumothorax.
IMPRESSION: 1.  Cardiomegaly.  Interval clearing of pulmonary edema.

2. Mild right base subsegmental atelectasis.

## 2017-08-21 DEATH — deceased
